# Patient Record
Sex: Female | Born: 1955 | Race: White | Hispanic: No | Marital: Single
Health system: Southern US, Academic
[De-identification: ages and names within clinical notes are randomized; demographics above are authoritative.]

## PROBLEM LIST (undated history)

## (undated) ENCOUNTER — Telehealth

## (undated) ENCOUNTER — Encounter

## (undated) ENCOUNTER — Ambulatory Visit: Payer: MEDICARE

## (undated) ENCOUNTER — Encounter: Attending: Surgical Oncology | Primary: Surgical Oncology

## (undated) ENCOUNTER — Ambulatory Visit: Attending: Surgery | Primary: Surgery

## (undated) ENCOUNTER — Ambulatory Visit: Attending: Radiation Oncology | Primary: Radiation Oncology

## (undated) ENCOUNTER — Encounter: Attending: Pharmacist | Primary: Pharmacist

## (undated) ENCOUNTER — Ambulatory Visit

## (undated) ENCOUNTER — Encounter: Attending: Obstetrics & Gynecology | Primary: Obstetrics & Gynecology

## (undated) ENCOUNTER — Encounter: Attending: Adult Health | Primary: Adult Health

## (undated) ENCOUNTER — Encounter
Attending: Student in an Organized Health Care Education/Training Program | Primary: Student in an Organized Health Care Education/Training Program

## (undated) ENCOUNTER — Telehealth: Attending: Pharmacist | Primary: Pharmacist

## (undated) ENCOUNTER — Ambulatory Visit: Payer: MEDICARE | Attending: Radiation Oncology | Primary: Radiation Oncology

## (undated) ENCOUNTER — Ambulatory Visit: Payer: MEDICARE | Attending: Surgical Oncology | Primary: Surgical Oncology

## (undated) ENCOUNTER — Encounter: Attending: Registered" | Primary: Registered"

## (undated) DIAGNOSIS — F329 Major depressive disorder, single episode, unspecified: Secondary | ICD-10-CM

## (undated) DIAGNOSIS — K219 Gastro-esophageal reflux disease without esophagitis: Secondary | ICD-10-CM

## (undated) DIAGNOSIS — C801 Malignant (primary) neoplasm, unspecified: Secondary | ICD-10-CM

## (undated) DIAGNOSIS — F32A Depression, unspecified: Secondary | ICD-10-CM

## (undated) MED ORDER — OMEPRAZOLE MAGNESIUM 20 MG TABLET,DELAYED RELEASE: Freq: Every day | ORAL | 0 days

## (undated) MED ORDER — ESTRADIOL 0.01% (0.1 MG/GRAM) VAGINAL CREAM: VAGINAL | 0.00000 days

## (undated) MED ORDER — LORATADINE-PSEUDOEPHEDRINE ER 10 MG-240 MG TABLET,EXTENDED RELEASE24HR: Freq: Every day | ORAL | 0 days

## (undated) MED ORDER — OMEPRAZOLE 10 MG CAPSULE,DELAYED RELEASE: Freq: Every morning | ORAL | 0 days

---

## 2004-07-19 ENCOUNTER — Ambulatory Visit: Payer: Self-pay | Admitting: Gastroenterology

## 2013-09-08 ENCOUNTER — Ambulatory Visit: Payer: Self-pay | Admitting: Gastroenterology

## 2013-09-12 DIAGNOSIS — D239 Other benign neoplasm of skin, unspecified: Secondary | ICD-10-CM

## 2013-09-12 HISTORY — DX: Other benign neoplasm of skin, unspecified: D23.9

## 2013-09-12 LAB — PATHOLOGY REPORT

## 2014-09-06 NOTE — Discharge Instructions (Signed)

## 2014-09-08 ENCOUNTER — Encounter
Admission: RE | Disposition: A | Discharge status: Home / Self Care | Payer: Self-pay | Source: Ambulatory Visit | Attending: Gastroenterology

## 2014-09-08 ENCOUNTER — Ambulatory Visit: Payer: BC Managed Care – PPO | Admitting: Student in an Organized Health Care Education/Training Program

## 2014-09-08 ENCOUNTER — Ambulatory Visit
Admission: RE | Admit: 2014-09-08 | Discharge: 2014-09-08 | Disposition: A | Discharge status: Home / Self Care | Payer: BC Managed Care – PPO | Source: Ambulatory Visit | Attending: Gastroenterology | Admitting: Gastroenterology

## 2014-09-08 ENCOUNTER — Encounter: Payer: Self-pay | Admitting: *Deleted

## 2014-09-08 DIAGNOSIS — K219 Gastro-esophageal reflux disease without esophagitis: Secondary | ICD-10-CM | POA: Diagnosis not present

## 2014-09-08 DIAGNOSIS — F329 Major depressive disorder, single episode, unspecified: Secondary | ICD-10-CM | POA: Diagnosis not present

## 2014-09-08 DIAGNOSIS — Z1211 Encounter for screening for malignant neoplasm of colon: Secondary | ICD-10-CM | POA: Diagnosis present

## 2014-09-08 DIAGNOSIS — Z79899 Other long term (current) drug therapy: Secondary | ICD-10-CM | POA: Insufficient documentation

## 2014-09-08 HISTORY — DX: Depression, unspecified: F32.A

## 2014-09-08 HISTORY — PX: COLONOSCOPY: SHX5424

## 2014-09-08 HISTORY — DX: Gastro-esophageal reflux disease without esophagitis: K21.9

## 2014-09-08 HISTORY — DX: Major depressive disorder, single episode, unspecified: F32.9

## 2014-09-08 SURGERY — COLONOSCOPY
Anesthesia: Monitor Anesthesia Care | Wound class: Contaminated

## 2014-09-08 MED ORDER — ACETAMINOPHEN 325 MG PO TABS: 325.0000 mg | ORAL_TABLET | ORAL | Status: DC | PRN

## 2014-09-08 MED ORDER — ONDANSETRON HCL 4 MG/2ML IJ SOLN: 4.0000 mg | Freq: Once | INTRAMUSCULAR | Status: DC | PRN

## 2014-09-08 MED ORDER — PROPOFOL 10 MG/ML IV BOLUS
INTRAVENOUS | Status: DC | PRN
Start: 2014-09-08 — End: 2014-09-08
  Administered 2014-09-08: 50 mg via INTRAVENOUS
  Administered 2014-09-08: 60 mg via INTRAVENOUS
  Administered 2014-09-08 (×2): 100 mg via INTRAVENOUS
  Administered 2014-09-08: 40 mg via INTRAVENOUS

## 2014-09-08 MED ORDER — LIDOCAINE HCL (CARDIAC) 20 MG/ML IV SOLN
INTRAVENOUS | Status: DC | PRN
  Administered 2014-09-08: 30 mg via INTRAVENOUS

## 2014-09-08 MED ORDER — STERILE WATER FOR IRRIGATION IR SOLN
Status: DC | PRN
  Administered 2014-09-08: 11:00:00

## 2014-09-08 MED ORDER — ACETAMINOPHEN 160 MG/5ML PO SOLN: 325.0000 mg | ORAL | Status: DC | PRN

## 2014-09-08 MED ORDER — LACTATED RINGERS IV SOLN
INTRAVENOUS | Status: DC
  Administered 2014-09-08: 11:00:00 via INTRAVENOUS

## 2014-09-08 SURGICAL SUPPLY — 27 items
CANISTER SUCT 1200ML W/VALVE (MISCELLANEOUS) ×3 IMPLANT
FCP ESCP3.2XJMB 240X2.8X (MISCELLANEOUS)
FORCEPS BIOP RAD 4 LRG CAP 4 (CUTTING FORCEPS) IMPLANT
FORCEPS BIOP RJ4 240 W/NDL (MISCELLANEOUS)
FORCEPS ESCP3.2XJMB 240X2.8X (MISCELLANEOUS) IMPLANT
GOWN CVR UNV OPN BCK APRN NK (MISCELLANEOUS) ×2 IMPLANT
GOWN ISOL THUMB LOOP REG UNIV (MISCELLANEOUS) ×4
HEMOCLIP INSTINCT (CLIP) IMPLANT
INJECTOR VARIJECT VIN23 (MISCELLANEOUS) IMPLANT
KIT CO2 TUBING (TUBING) ×3 IMPLANT
KIT DEFENDO VALVE AND CONN (KITS) IMPLANT
KIT ENDO PROCEDURE OLY (KITS) ×3 IMPLANT
LIGATOR MULTIBAND 6SHOOTER MBL (MISCELLANEOUS) IMPLANT
MARKER SPOT ENDO TATTOO 5ML (MISCELLANEOUS) IMPLANT
PAD GROUND ADULT SPLIT (MISCELLANEOUS) IMPLANT
SNARE SHORT THROW 13M SML OVAL (MISCELLANEOUS) IMPLANT
SNARE SHORT THROW 30M LRG OVAL (MISCELLANEOUS) IMPLANT
SPOT EX ENDOSCOPIC TATTOO (MISCELLANEOUS)
SUCTION POLY TRAP 4CHAMBER (MISCELLANEOUS) IMPLANT
TRAP SUCTION POLY (MISCELLANEOUS) IMPLANT
TUBING CONN 6MMX3.1M (TUBING)
TUBING SUCTION CONN 0.25 STRL (TUBING) IMPLANT
UNDERPAD 30X60 958B10 (PK) (MISCELLANEOUS) IMPLANT
VALVE BIOPSY ENDO (VALVE) IMPLANT
VARIJECT INJECTOR VIN23 (MISCELLANEOUS)
WATER AUXILLARY (MISCELLANEOUS) IMPLANT
WATER STERILE IRR 500ML POUR (IV SOLUTION) ×3 IMPLANT

## 2014-09-08 NOTE — Anesthesia Postprocedure Evaluation (Signed)
  Anesthesia Post-op Note  Patient: Teresa Ponce  Procedure(s) Performed: Procedure(s): COLONOSCOPY (N/A)  Anesthesia type:MAC  Patient location: PACU  Post pain: Pain level controlled  Post assessment: Post-op Vital signs reviewed, Patient's Cardiovascular Status Stable, Respiratory Function Stable, Patent Airway and No signs of Nausea or vomiting  Post vital signs: Reviewed and stable  Last Vitals:  Filed Vitals:   09/08/14 1115  BP: 77/44  Pulse: 58  Temp:   Resp: 13    Level of consciousness: awake, alert  and patient cooperative  Complications: No apparent anesthesia complications

## 2014-09-08 NOTE — Transfer of Care (Signed)
Immediate Anesthesia Transfer of Care Note  Patient: Teresa Ponce  Procedure(s) Performed: Procedure(s): COLONOSCOPY (N/A)  Patient Location: PACU  Anesthesia Type: MAC  Level of Consciousness: awake, alert  and patient cooperative  Airway and Oxygen Therapy: Patient Spontanous Breathing and Patient connected to supplemental oxygen  Post-op Assessment: Post-op Vital signs reviewed, Patient's Cardiovascular Status Stable, Respiratory Function Stable, Patent Airway and No signs of Nausea or vomiting  Post-op Vital Signs: Reviewed and stable  Complications: No apparent anesthesia complications

## 2014-09-08 NOTE — Anesthesia Procedure Notes (Signed)
Procedure Name: MAC Performed by: Celise Bazar Pre-anesthesia Checklist: Patient identified, Emergency Drugs available, Suction available, Patient being monitored and Timeout performed Patient Re-evaluated:Patient Re-evaluated prior to inductionOxygen Delivery Method: Nasal cannula       

## 2014-09-08 NOTE — Anesthesia Preprocedure Evaluation (Signed)
Anesthesia Evaluation  Patient identified by MRN, date of birth, ID band Patient awake    Reviewed: Allergy & Precautions, NPO status , Patient's Chart, lab work & pertinent test results  Airway Mallampati: II  TM Distance: >3 FB Neck ROM: Full    Dental   Pulmonary    Pulmonary exam normal       Cardiovascular Normal cardiovascular exam    Neuro/Psych PSYCHIATRIC DISORDERS Depression    GI/Hepatic GERD-  ,  Endo/Other    Renal/GU      Musculoskeletal   Abdominal   Peds  Hematology   Anesthesia Other Findings   Reproductive/Obstetrics                             Anesthesia Physical Anesthesia Plan  ASA: II  Anesthesia Plan: MAC   Post-op Pain Management:    Induction: Intravenous  Airway Management Planned: Nasal Cannula  Additional Equipment:   Intra-op Plan:   Post-operative Plan:   Informed Consent: I have reviewed the patients History and Physical, chart, labs and discussed the procedure including the risks, benefits and alternatives for the proposed anesthesia with the patient or authorized representative who has indicated his/her understanding and acceptance.     Plan Discussed with: CRNA  Anesthesia Plan Comments:         Anesthesia Quick Evaluation

## 2014-09-08 NOTE — H&P (Signed)
  [facility]  [address], [place], [place] [address] Phone: [phone] Fax : [phone]  Primary Care Physician:  Laverta Baltimore, MD Primary Gastroenterologist:  Dr. Allen Norris  Pre-Procedure History & Physical: HPI:  Teresa Ponce is a 59 y.o. female is here for a screening colonoscopy.   Past Medical History  Diagnosis Date  . Depression   . GERD (gastroesophageal reflux disease)     History reviewed. No pertinent past surgical history.  Prior to Admission medications   Medication Sig Start Date End Date Taking? Authorizing Provider  Calcium-Vitamin D (CALTRATE 600 PLUS-VIT D PO) Take by mouth 2 (two) times daily.   Yes Historical Provider, MD  citalopram (CELEXA) 20 MG tablet Take 20 mg by mouth every morning.   Yes Historical Provider, MD  Melatonin 1 MG TABS Take by mouth at bedtime.   Yes Historical Provider, MD  omeprazole (PRILOSEC) 10 MG capsule Take 10 mg by mouth every morning.   Yes Historical Provider, MD    Allergies as of 08/28/2014  . (Not on File)    History reviewed. No pertinent family history.  History   Social History  . Marital Status: Married    Spouse Name: N/A  . Number of Children: N/A  . Years of Education: N/A   Occupational History  . Not on file.   Social History Main Topics  . Smoking status: Never Smoker   . Smokeless tobacco: Not on file  . Alcohol Use: Yes     Comment: socially  . Drug Use: Not on file  . Sexual Activity: Not on file   Other Topics Concern  . Not on file   Social History Narrative    Review of Systems: See HPI, otherwise negative ROS  Physical Exam: BP 142/65 mmHg  Pulse 65  Temp(Src) 97.9 F (36.6 C)  Resp 17  Ht 5\' 6"  (1.676 m)  Wt 137 lb (62.143 kg)  BMI 22.12 kg/m2  SpO2 99% General:   Alert,  pleasant and cooperative in NAD Head:  Normocephalic and atraumatic. Neck:  Supple; no masses or thyromegaly. Lungs:  Clear throughout to auscultation.    Heart:   Regular rate and rhythm. Abdomen:  Soft, nontender and nondistended. Normal bowel sounds, without guarding, and without rebound.   Neurologic:  Alert and  oriented x4;  grossly normal neurologically.  Impression/Plan: Teresa Ponce is now here to undergo a screening colonoscopy.  Risks, benefits, and alternatives regarding colonoscopy have been reviewed with the patient.  Questions have been answered.  All parties agreeable.

## 2014-09-08 NOTE — Op Note (Signed)
[facility] Patient Name: Teresa Ponce Procedure Date: 09/08/2014 10:34 AM MRN: [id number] Account #: [id number] Date of Birth: [birth year] Admit Type: Outpatient Age: 59 Room: [facility] OR ROOM 01 Gender: Female Note Status: Finalized Procedure:         Colonoscopy Indications:       Screening for colorectal malignant neoplasm Providers:         Lucilla Lame, MD Referring MD:      Boykin Nearing, MD (Referring MD) Medicines:         Propofol per Anesthesia Complications:     No immediate complications. Procedure:         Pre-Anesthesia Assessment:                    - Prior to the procedure, a History and Physical was                     performed, and patient medications and allergies were                     reviewed. The patient's tolerance of previous anesthesia                     was also reviewed. The risks and benefits of the procedure                     and the sedation options and risks were discussed with the                     patient. All questions were answered, and informed consent                     was obtained. Prior Anticoagulants: The patient has taken                     no previous anticoagulant or antiplatelet agents. ASA                     Grade Assessment: II - A patient with mild systemic                     disease. After reviewing the risks and benefits, the                     patient was deemed in satisfactory condition to undergo                     the procedure.                    After obtaining informed consent, the colonoscope was                     passed under direct vision. Throughout the procedure, the                     patient's blood pressure, pulse, and oxygen saturations                     were monitored continuously. The was introduced through                     the anus and advanced to the the cecum, identified by  appendiceal orifice and ileocecal valve. The colonoscopy                      was performed without difficulty. The patient tolerated                     the procedure well. The quality of the bowel preparation                     was excellent. Findings:      The perianal and digital rectal examinations were normal.      The colon (entire examined portion) appeared normal. Impression:        - The entire examined colon is normal.                    - No specimens collected. Recommendation:    - Repeat colonoscopy in 10 years for screening unless any                     change in family history or lower GI problems. Procedure Code(s): --- Professional ---                    [id number], Colonoscopy, flexible; diagnostic, including                     collection of specimen(s) by brushing or washing, when                     performed (separate procedure) Diagnosis Code(s): --- Professional ---                    Z12.11, Encounter for screening for malignant neoplasm of                     colon CPT copyright 2014 American Medical Association. All rights reserved. The codes documented in this report are preliminary and upon coder review may  be revised to meet current compliance requirements. Lucilla Lame, MD 09/08/2014 11:04:30 AM This report has been signed electronically. Number of Addenda: 0 Note Initiated On: 09/08/2014 10:34 AM Scope Withdrawal Time: 0 hours 6 minutes 27 seconds  Total Procedure Duration: 0 hours 12 minutes 10 seconds       [facility]

## 2014-09-12 ENCOUNTER — Encounter: Payer: Self-pay | Admitting: Gastroenterology

## 2017-08-12 ENCOUNTER — Encounter: Admit: 2017-08-12 | Discharge: 2017-08-12 | Payer: MEDICARE

## 2017-08-12 DIAGNOSIS — Z9289 Personal history of other medical treatment: Principal | ICD-10-CM

## 2018-09-15 ENCOUNTER — Ambulatory Visit: Admit: 2018-09-15 | Discharge: 2018-09-16 | Payer: MEDICARE

## 2018-09-15 DIAGNOSIS — Z1231 Encounter for screening mammogram for malignant neoplasm of breast: Principal | ICD-10-CM

## 2019-06-16 ENCOUNTER — Ambulatory Visit: Payer: Medicare PPO

## 2019-09-15 DIAGNOSIS — Z1231 Encounter for screening mammogram for malignant neoplasm of breast: Principal | ICD-10-CM

## 2019-09-19 ENCOUNTER — Ambulatory Visit: Admit: 2019-09-19 | Discharge: 2019-09-20 | Payer: MEDICARE

## 2019-09-23 DIAGNOSIS — R928 Other abnormal and inconclusive findings on diagnostic imaging of breast: Principal | ICD-10-CM

## 2019-10-03 ENCOUNTER — Other Ambulatory Visit: Payer: Self-pay

## 2019-10-03 ENCOUNTER — Ambulatory Visit: Payer: Medicare PPO | Admitting: Dermatology

## 2019-10-03 ENCOUNTER — Encounter: Payer: Self-pay | Admitting: Dermatology

## 2019-10-03 DIAGNOSIS — Z1283 Encounter for screening for malignant neoplasm of skin: Secondary | ICD-10-CM | POA: Diagnosis not present

## 2019-10-03 DIAGNOSIS — D229 Melanocytic nevi, unspecified: Secondary | ICD-10-CM

## 2019-10-03 DIAGNOSIS — L720 Epidermal cyst: Secondary | ICD-10-CM | POA: Diagnosis not present

## 2019-10-03 DIAGNOSIS — L821 Other seborrheic keratosis: Secondary | ICD-10-CM

## 2019-10-03 DIAGNOSIS — L918 Other hypertrophic disorders of the skin: Secondary | ICD-10-CM

## 2019-10-03 DIAGNOSIS — L719 Rosacea, unspecified: Secondary | ICD-10-CM

## 2019-10-03 DIAGNOSIS — L814 Other melanin hyperpigmentation: Secondary | ICD-10-CM

## 2019-10-03 DIAGNOSIS — Z86018 Personal history of other benign neoplasm: Secondary | ICD-10-CM

## 2019-10-03 DIAGNOSIS — D1801 Hemangioma of skin and subcutaneous tissue: Secondary | ICD-10-CM

## 2019-10-03 DIAGNOSIS — L578 Other skin changes due to chronic exposure to nonionizing radiation: Secondary | ICD-10-CM

## 2019-10-03 MED ORDER — METRONIDAZOLE 0.75 % EX GEL: Freq: Every day | CUTANEOUS | 12 refills | Status: DC

## 2019-10-03 MED ORDER — TRETINOIN 0.025 % EX GEL: Freq: Every evening | CUTANEOUS | 12 refills | Status: AC

## 2019-10-03 NOTE — Progress Notes (Signed)
   Follow-Up Visit   Subjective  Teresa Ponce is a 64 y.o. female who presents for the following: Annual Exam (History of dysplastic nevus of left axilla. TBSE today.). The patient presents for Total-Body Skin Exam (TBSE) for skin cancer screening and mole check.  The following portions of the chart were reviewed this encounter and updated as appropriate:  Allergies  Meds  Problems  Med Hx  Surg Hx  Fam Hx      Review of Systems:  No other skin or systemic complaints except as noted in HPI or Assessment and Plan.  Objective  Well appearing patient in no apparent distress; mood and affect are within normal limits.  A full examination was performed including scalp, head, eyes, ears, nose, lips, neck, chest, axillae, abdomen, back, buttocks, bilateral upper extremities, bilateral lower extremities, hands, feet, fingers, toes, fingernails, and toenails. All findings within normal limits unless otherwise noted below.  Objective  Left Axilla: Scar with no evidence of recurrence.   Objective  face: Dilated blood vessels, otherwise clear.  Objective  face: Smooth white papule(s).    Assessment & Plan    Acrochordons (Skin Tags) - Fleshy, skin-colored pedunculated papules - Benign appearing.  - Observe. - If desired, they can be removed with an in office procedure that is not covered by insurance. - Please call the clinic if you notice any new or changing lesions.  Lentigines - Scattered tan macules - Discussed due to sun exposure - Benign, observe - Call for any changes  Seborrheic Keratoses - Stuck-on, waxy, tan-brown papules and plaques  - Discussed benign etiology and prognosis. - Observe - Call for any changes  Melanocytic Nevi - Tan-brown and/or pink-flesh-colored symmetric macules and papules - Benign appearing on exam today - Observation - Call clinic for new or changing moles - Recommend daily use of broad spectrum spf 30+ sunscreen to sun-exposed  areas.   Hemangiomas - Red papules - Discussed benign nature - Observe - Call for any changes  Actinic Damage - diffuse scaly erythematous macules with underlying dyspigmentation - Recommend daily broad spectrum sunscreen SPF 30+ to sun-exposed areas, reapply every 2 hours as needed.  - Call for new or changing lesions.  Skin cancer screening performed today.   History of dysplastic nevus Left Axilla Clear today. Observe  Rosacea Face Continue Metrogel qd - bid  Milia/ Acne milia of face face Altreno sample given or she may buy The Perfect A here.  tretinoin (RETIN-A) 0.025 % gel - face  Return in about 1 year (around 10/02/2020).  I, Ashok Cordia, CMA, am acting as scribe for Sarina Ser, MD .  Documentation: I have reviewed the above documentation for accuracy and completeness, and I agree with the above.  Sarina Ser, MD

## 2019-10-04 ENCOUNTER — Encounter: Payer: Self-pay | Admitting: Dermatology

## 2019-10-10 ENCOUNTER — Ambulatory Visit: Admit: 2019-10-10 | Discharge: 2019-10-11 | Payer: MEDICARE

## 2019-10-11 ENCOUNTER — Ambulatory Visit: Admit: 2019-10-11 | Discharge: 2019-10-12 | Payer: MEDICARE

## 2019-10-11 DIAGNOSIS — R928 Other abnormal and inconclusive findings on diagnostic imaging of breast: Principal | ICD-10-CM

## 2019-10-12 ENCOUNTER — Ambulatory Visit: Admit: 2019-10-12 | Discharge: 2019-10-13 | Payer: MEDICARE

## 2019-10-19 ENCOUNTER — Ambulatory Visit
Admit: 2019-10-19 | Discharge: 2019-10-20 | Payer: MEDICARE | Attending: Radiation Oncology | Primary: Radiation Oncology

## 2019-10-19 ENCOUNTER — Ambulatory Visit: Admit: 2019-10-19 | Discharge: 2019-10-20 | Payer: MEDICARE

## 2019-10-19 DIAGNOSIS — Z170 Estrogen receptor positive status [ER+]: Principal | ICD-10-CM

## 2019-10-19 DIAGNOSIS — C50911 Malignant neoplasm of unspecified site of right female breast: Principal | ICD-10-CM

## 2019-10-19 DIAGNOSIS — C50411 Malignant neoplasm of upper-outer quadrant of right female breast: Principal | ICD-10-CM

## 2019-10-21 DIAGNOSIS — C50911 Malignant neoplasm of unspecified site of right female breast: Principal | ICD-10-CM

## 2019-10-21 DIAGNOSIS — Z170 Estrogen receptor positive status [ER+]: Principal | ICD-10-CM

## 2019-10-24 ENCOUNTER — Ambulatory Visit: Admit: 2019-10-24 | Discharge: 2019-10-25 | Payer: MEDICARE

## 2019-10-31 DIAGNOSIS — C50911 Malignant neoplasm of unspecified site of right female breast: Principal | ICD-10-CM

## 2019-10-31 DIAGNOSIS — Z170 Estrogen receptor positive status [ER+]: Principal | ICD-10-CM

## 2019-10-31 MED ORDER — PEGFILGRASTIM-BMEZ 6 MG/0.6 ML SUBCUTANEOUS SYRINGE
Freq: Once | SUBCUTANEOUS | 5 refills | 1.00000 days | Status: CP
Start: 2019-10-31 — End: 2019-10-31
  Filled 2019-11-04: qty 0.6, 21d supply, fill #0

## 2019-11-01 ENCOUNTER — Ambulatory Visit: Admit: 2019-11-01 | Discharge: 2019-11-01 | Payer: MEDICARE

## 2019-11-01 DIAGNOSIS — Z170 Estrogen receptor positive status [ER+]: Principal | ICD-10-CM

## 2019-11-01 DIAGNOSIS — C50911 Malignant neoplasm of unspecified site of right female breast: Principal | ICD-10-CM

## 2019-11-02 DIAGNOSIS — Z170 Estrogen receptor positive status [ER+]: Principal | ICD-10-CM

## 2019-11-02 DIAGNOSIS — C50911 Malignant neoplasm of unspecified site of right female breast: Principal | ICD-10-CM

## 2019-11-04 MED FILL — ZIEXTENZO 6 MG/0.6 ML SUBCUTANEOUS SYRINGE: 21 days supply | Qty: 1 | Fill #0 | Status: AC

## 2019-11-04 NOTE — Unmapped (Signed)
[facility]   Patient Onboarding/Medication Counseling    Robin Spence is a 64 y.o. female with breast cancer (neutropenia) who I am counseling today on initiation of therapy.  I am speaking to the patient.    Was a Nurse, learning disability used for this call? No    Verified patient's date of birth / HIPAA.    Specialty medication(s) to be sent: Hematology/Oncology: Ziextenzo      Non-specialty medications/supplies to be sent: n/a      Medications not needed at this time: n/a         Ziextenzo (pegfilgrastim)    Medication & Administration     Dosage: inject the contents of 1 syringe under the skin once 24 hours after chemotherapy.  A friend (nurse or EMT) will be giving her the injection    Administration: Inject under the skin of the thigh, abdomen, buttocks or upper arm. Rotate sites with each injection.  ??? Injection instructions   o Take 1 syringe out of the refrigerator and allow to stand at room temperature for at least 15-30 minutes  o Wash hands and remove syringe from the tray  o Check the syringe for the following   - Expiration date  - Medication is clear and colorless to slightly yellow and free from particles   - It appears unused or damaged and the gray needle cap is securely attached and the clear needle guard has not been activated (if the needle guard is covering the needle that means it has been activated)  o Choose your injection site (abdomen but not within 2 inches of the navel, thigh, or if someone else is injecting you may also use upper arms or upper outer area of buttock).  Choose a different site each time you give yourself an injection and do not inject into areas where the skin is tender, bruised, red, scaly or hard.  Avoid areas with scars or stretch marks  o Clean the injection site with an alcohol wipe using a circular motion and allow it to air dry completely (5-10 seconds)  o Hold the prefilled syringe by the syringe barrel.  Carefully pull the needle cap straight off and discard  o With your other hand, gently pinch the skin at the injection site to create a firm surface.  Insert the needle in to skin at a 45-90 degree angle. Push the needle all the way in to ensure that the medicine can be fully injected.  o Slowly press down the plunger head as far as it will go until the plunger head is completely between the needle guard wings (keep skin pinched during this)  o Keep the plunger fully pressed down while you carefully pull the needle straight out from the injection site and off your skin  o Slowly release the plunger and allow the syringe needle guard to automatically cover the exposed needle  o Dispose of the used prefilled syringe into a sharps container or hard plastic bottle.  o If there is blood at the injection site gently press a cotton ball or gauze to the site. Do not rub the injection site. Apply an adhesive bandage if needed    Adherence/Missed dose instruction: If a dose is missed, call your doctor.    Goals of Therapy     Stimulate the growth of neutrophils (a type of white blood important to fight against infection) used after chemotherapy.    Side Effects & Monitoring Parameters   ??? Injection site irritation  ???  Pain/aching in the bones, arms and legs    The following side effects should be reported to the provider:  ??? Signs of an allergic reaction (rash, hives, shortness of breath, tongue or throat swelling)  ??? Kidney problems (unable to pass urine, blood in urine, change in amount of urine passed, change in urine color, or weight gain)  ??? Lung problems (trouble breathing, new or worsening of cough)  ??? Capillary leak syndrome (abnormal heartbeat, chest pain, shortness of breath, weight gain, vomiting blood or vomit that looks like coffee grounds, black or tarry stools)  ??? Spleen rupture (pain in left upper stomach area or left shoulder)  ??? Inflammation of the aorta (fever, abdominal pain, fatigue, back pain)                Contraindications, Warnings, & Precautions ??? Hypersensitivity to pegfilgrastim, filgrastim, or any components of the formulation  ??? Allergy to latex  ??? Pegfilgrastim does cross the placenta therefore you should not become pregnant during treatment  ??? It is not known if pegfilgrastim passes into breastmilk therefore not recommended      Drug/Food Interactions     ??? Medication list reviewed in Epic. The patient was instructed to inform the care team before taking any new medications or supplements. No drug interactions identified.     Storage, Handling Precautions, & Disposal     ??? Ziextenzo should be stored in the refrigerator.   ??? Avoid freezing syringe but if frozen may be thawed one time. If frozen more than 1 time use a new syringe  ??? Discard if kept at room temperature for >120 hours  ??? Do not open the outer carton until you are ready to use the syringe in order to protect it from light  ??? Do not use if the syringe has been dropped on a hard surface.  It may be broken even if you cannot see the break  ??? Do not shake the prefilled syringe  ??? Keep out of the reach of children  ??? Place used devices into a sharps container for disposal (which we can supply along with band-aids and alcohol pads) or hard plastic container       Current Medications (including OTC/herbals), Comorbidities and Allergies     Current Outpatient Medications   Medication Sig Dispense Refill   ??? citalopram (CELEXA) 20 MG tablet Take 20 mg by mouth daily.     ??? cyanocobalamin, vitamin B-12, 1000 MCG tablet Take 1 tablet by mouth Take as directed.     ??? famotidine (PEPCID) 40 MG tablet Take 40 mg by mouth Take as directed.     ??? fish oil-omega-3 fatty acids 300-1,000 mg capsule Take 1 capsule by mouth Take as directed.     ??? melatonin 1 mg Tab tablet Take 1 tablet by mouth at bedtime as needed.     ??? metroNIDAZOLE (METROGEL) 0.75 % gel Apply 1 application topically Take as directed.     ??? needles, pharmacy compound 20 x 1  Ndle Insert 1 application into the vagina Take as directed. ??? [START ON 11/06/2019] ondansetron (ZOFRAN) 8 MG tablet Take 1 tablet (8 mg total) by mouth every eight (8) hours as needed for nausea (or vomiting). 30 tablet 2   ??? [START ON 11/06/2019] prochlorperazine (COMPAZINE) 10 MG tablet Take 1 tablet (10 mg total) by mouth every six (6) hours as needed (nausea or vomitting). 30 tablet 2   ??? tretinoin (RETIN-A) 0.025 % gel Apply 1 application topically  Take as directed.       Current Facility-Administered Medications   Medication Dose Route Frequency Provider Last Rate Last Admin   ??? lidocaine-EPINEPHrine (XYLOCAINE W/EPI) 1 %-1:100,000 injection 4 mL  4 mL Intradermal Once Genia Del, ANP           Allergies   Allergen Reactions   ??? Sulfa (Sulfonamide Antibiotics) Hives   ??? Tetanus Vaccines And Toxoid Hives     As a child       Patient Active Problem List   Diagnosis   ??? Malignant neoplasm of right breast in female, estrogen receptor positive (CMS-HCC)       Reviewed and up to date in Epic.    Appropriateness of Therapy     Is medication and dose appropriate based on diagnosis? Yes    Prescription has been clinically reviewed: Yes    Baseline Quality of Life Assessment      How many days over the past month did your condition  keep you from your normal activities? For example, brushing your teeth or getting up in the morning. 0    Financial Information     Medication Assistance provided: Prior Authorization    Anticipated copay of $100 reviewed with patient. Verified delivery address.    Delivery Information     Scheduled delivery date: 11/04/19    Expected start date: 11/08/19    Medication will be delivered via Same Day Courier to the prescription address in [facility].  This shipment will not require a signature.      Explained the services we provide at [facility] and that each month we would call to set up refills.  Stressed importance of returning phone calls so that we could ensure they receive their medications in time each month. Informed patient that we should be setting up refills 7-10 days prior to when they will run out of medication.  A pharmacist will reach out to perform a clinical assessment periodically.  Informed patient that a welcome packet and a drug information handout will be sent.      Patient verbalized understanding of the above information as well as how to contact the pharmacy at [phone] option 4 with any questions/concerns.  The pharmacy is open Monday through Friday 8:30am-4:30pm.  A pharmacist is available 24/7 via pager to answer any clinical questions they may have.    Patient Specific Needs     - Does the patient have any physical, cognitive, or cultural barriers? No    - Patient prefers to have medications discussed with  Patient     - Is the patient or caregiver able to read and understand education materials at a high school level or above? Yes    - Patient's primary language is  English     - Is the patient high risk? No     - Does the patient require a Care Management Plan? No     - Does the patient require physician intervention or other additional services (i.e. nutrition, smoking cessation, social work)? No      Florene Route [facility] Specialty Pharmacist

## 2019-11-04 NOTE — Unmapped (Signed)
Pharmacy medication counseling    Patient Information: Robin Spence is a 64 y.o. female with breast cancer who I have counseled prior to the initiation of pegfilgrastim by self-administration.     I called Robin Spence and explained that her insurance has denied Neulasta Onpro.  Her insurance has approved Ziextenzo to be supplied by the [employer].  She has consented to pay the copay for the first syringe but would like to seek manufacturer assistance for refills.  If she is not able to get manufacturer assistance for self-administration, we will determine if she can return to [facility] for nurse administration of Ziextenzo, as a back-up for subsequent cycles.    I reviewed that pegfilgrastim is a subcutaneous injection administered 24 hours post-chemotherapy for prevention of chemotherapy-induced neutropenia   The most common adverse effects are injection site pain and bone pain..  This medication should be kept refrigerated but can be removed from the fridge 30 minutes prior to administration to decrease injection site pain.  I discussed proper administration of a SQ injection as well as syringe storage and disposal.    Handout provided:  Patient education handout for pegfilgrastim    Patient verbalized understanding of the above information.  Approximate time spent with patient: 15 minutes

## 2019-11-06 MED ORDER — PROCHLORPERAZINE MALEATE 10 MG TABLET
ORAL_TABLET | Freq: Four times a day (QID) | ORAL | 2 refills | 8 days | Status: CP | PRN
Start: 2019-11-06 — End: ?

## 2019-11-06 MED ORDER — ONDANSETRON HCL 8 MG TABLET
ORAL_TABLET | Freq: Three times a day (TID) | ORAL | 2 refills | 10 days | Status: CP | PRN
Start: 2019-11-06 — End: ?

## 2019-11-07 ENCOUNTER — Other Ambulatory Visit: Admit: 2019-11-07 | Discharge: 2019-11-08 | Payer: MEDICARE

## 2019-11-07 ENCOUNTER — Ambulatory Visit: Admit: 2019-11-07 | Discharge: 2019-11-08 | Payer: MEDICARE | Attending: Adult Health | Primary: Adult Health

## 2019-11-07 ENCOUNTER — Ambulatory Visit: Admit: 2019-11-07 | Discharge: 2019-11-08 | Payer: MEDICARE

## 2019-11-07 DIAGNOSIS — C50911 Malignant neoplasm of unspecified site of right female breast: Principal | ICD-10-CM

## 2019-11-07 DIAGNOSIS — Z170 Estrogen receptor positive status [ER+]: Principal | ICD-10-CM

## 2019-11-07 LAB — EGFR CKD-EPI AA FEMALE: Glomerular filtration rate/1.73 sq M.predicted.black:ArVRat:Pt:Ser/Plas/Bld:Qn:Creatinine-based formula (CKD-EPI): 89

## 2019-11-07 LAB — CBC W/ AUTO DIFF
BASOPHILS ABSOLUTE COUNT: 0 10*9/L (ref 0.0–0.1)
BASOPHILS RELATIVE PERCENT: 0.5 %
EOSINOPHILS ABSOLUTE COUNT: 0.1 10*9/L (ref 0.0–0.4)
EOSINOPHILS RELATIVE PERCENT: 1.3 %
HEMOGLOBIN: 13.4 g/dL (ref 12.0–16.0)
LARGE UNSTAINED CELLS: 2 % (ref 0–4)
LYMPHOCYTES ABSOLUTE COUNT: 1.3 10*9/L — ABNORMAL LOW (ref 1.5–5.0)
LYMPHOCYTES RELATIVE PERCENT: 21.7 %
MEAN CORPUSCULAR HEMOGLOBIN CONC: 32.5 g/dL (ref 31.0–37.0)
MEAN CORPUSCULAR HEMOGLOBIN: 30.7 pg (ref 26.0–34.0)
MEAN CORPUSCULAR VOLUME: 94.7 fL (ref 80.0–100.0)
MEAN PLATELET VOLUME: 8.1 fL (ref 7.0–10.0)
MONOCYTES ABSOLUTE COUNT: 0.4 10*9/L (ref 0.2–0.8)
NEUTROPHILS ABSOLUTE COUNT: 3.9 10*9/L (ref 2.0–7.5)
NEUTROPHILS RELATIVE PERCENT: 67.8 %
PLATELET COUNT: 219 10*9/L (ref 150–440)
RED BLOOD CELL COUNT: 4.36 10*12/L (ref 4.00–5.20)
RED CELL DISTRIBUTION WIDTH: 13.3 % (ref 12.0–15.0)
WBC ADJUSTED: 5.8 10*9/L (ref 4.5–11.0)

## 2019-11-07 LAB — HEPATIC FUNCTION PANEL
ALBUMIN: 4.1 g/dL (ref 3.4–5.0)
ALKALINE PHOSPHATASE: 62 U/L (ref 46–116)
ALT (SGPT): 16 U/L (ref 10–49)
AST (SGOT): 23 U/L (ref <=34)
BILIRUBIN TOTAL: 0.5 mg/dL (ref 0.3–1.2)

## 2019-11-07 LAB — CREATININE: EGFR CKD-EPI NON-AA FEMALE: 77 mL/min/{1.73_m2} (ref >=60)

## 2019-11-07 LAB — ALT (SGPT): Alanine aminotransferase:CCnc:Pt:Ser/Plas:Qn:: 16

## 2019-11-07 LAB — MEAN CORPUSCULAR HEMOGLOBIN CONC: Erythrocyte mean corpuscular hemoglobin concentration:MCnc:Pt:RBC:Qn:Automated count: 32.5

## 2019-11-07 MED ORDER — LIDOCAINE-PRILOCAINE 2.5 %-2.5 % TOPICAL CREAM
1 refills | 0 days | Status: CP
Start: 2019-11-07 — End: ?

## 2019-11-07 MED ADMIN — ondansetron (ZOFRAN) tablet 24 mg: 24 mg | ORAL | @ 17:00:00 | Stop: 2019-11-07

## 2019-11-07 MED ADMIN — trastuzumab-anns (KANJINTI) 504 mg in sodium chloride (NS) 0.9 % 250 mL IVPB: 8 mg/kg | INTRAVENOUS | @ 19:00:00 | Stop: 2019-11-07

## 2019-11-07 MED ADMIN — CARBOplatin (PARAPLATIN) 562.8 mg in sodium chloride (NS) 0.9 % 100 mL IVPB: 562.8 mg | INTRAVENOUS | @ 22:00:00 | Stop: 2019-11-07

## 2019-11-07 MED ADMIN — fosaprepitant (EMEND) 150 mg in sodium chloride (NS) 0.9 % 100 mL IVPB: 150 mg | INTRAVENOUS | @ 17:00:00 | Stop: 2019-11-07

## 2019-11-07 MED ADMIN — DOCEtaxel (TAXOTERE) 128.3 mg in sodium chloride 0.9% NON-PVC (NS) 0.9 % 250 mL IVPB: 75 mg/m2 | INTRAVENOUS | @ 21:00:00 | Stop: 2019-11-07

## 2019-11-07 MED ADMIN — heparin, porcine (PF) 100 unit/mL injection 500 Units: 500 [IU] | INTRAVENOUS | @ 23:00:00 | Stop: 2019-11-08

## 2019-11-07 MED ADMIN — dexAMETHasone (DECADRON) tablet 8 mg: 8 mg | ORAL | @ 17:00:00 | Stop: 2019-11-07

## 2019-11-07 MED ADMIN — pertuzumab (PERJETA) 840 mg in sodium chloride (NS) 0.9 % 250 mL IVPB: 840 mg | INTRAVENOUS | @ 18:00:00 | Stop: 2019-11-07

## 2019-11-07 MED ADMIN — sodium chloride (NS) 0.9 % infusion: 100 mL/h | INTRAVENOUS | @ 17:00:00

## 2019-11-07 NOTE — Unmapped (Signed)
Pharmacy: First Cycle Chemotherapy Patient Education    Chemotherapy regimen/agents: TCHP  Venous Access: Port  Caregivers present for education: Sister    Ms. Robin Spence is a 64 year old with breast cancer. Chemotherapy education was provided to the patient by an oncology pharmacy resident.      Side effects discussed included but were not limited to:   infusion-related reactions, complications associated with myelosuppresion (such as infection/fever, fatigue, and bleeding), nausea/vomiting, diarrhea, constipation, flu-like symptoms, hair loss, peripheral neuropathy or fatigue.    Confirm patient has Ziextenzo at home, and she has a plan for administration on day 3. Reviewed prevention of bone pain     [facility] patient handout, antiemetic handout or the Hematology/Oncology Fellow on-call phone number for concerns after hours were given.The patient verbalized understanding of this information.  Medication reconciliation was completed and the medication list was updated in EPIC.      Approximate time spent with patient: 15  Minutes.    Tanica Gaige Laveda Norman, PharmD  Kennon Holter, PharmD, CPP

## 2019-11-07 NOTE — Unmapped (Signed)
Lab on 11/07/2019   Component Date Value Ref Range Status   ??? Creatinine 11/07/2019 0.80  0.60 - 0.80 mg/dL Final   ??? EGFR CKD-EPI Non-African American,* 11/07/2019 77  >=60 mL/min/1.10m2 Final   ??? EGFR CKD-EPI African American, Fem* 11/07/2019 89  >=60 mL/min/1.40m2 Final   ??? Albumin 11/07/2019 4.1  3.4 - 5.0 g/dL Final   ??? Total Protein 11/07/2019 6.8  5.7 - 8.2 g/dL Final   ??? Total Bilirubin 11/07/2019 0.5  0.3 - 1.2 mg/dL Final   ??? Bilirubin, Direct 11/07/2019 0.10  0.00 - 0.30 mg/dL Final   ??? AST 08/65/7846 23  <=34 U/L Final   ??? ALT 11/07/2019 16  10 - 49 U/L Final   ??? Alkaline Phosphatase 11/07/2019 62  46 - 116 U/L Final   ??? WBC 11/07/2019 5.8  4.5 - 11.0 10*9/L Final   ??? RBC 11/07/2019 4.36  4.00 - 5.20 10*12/L Final   ??? HGB 11/07/2019 13.4  12.0 - 16.0 g/dL Final   ??? HCT 96/29/5284 41.3  36.0 - 46.0 % Final   ??? MCV 11/07/2019 94.7  80.0 - 100.0 fL Final   ??? MCH 11/07/2019 30.7  26.0 - 34.0 pg Final   ??? MCHC 11/07/2019 32.5  31.0 - 37.0 g/dL Final   ??? RDW 13/24/4010 13.3  12.0 - 15.0 % Final   ??? MPV 11/07/2019 8.1  7.0 - 10.0 fL Final   ??? Platelet 11/07/2019 219  150 - 440 10*9/L Final   ??? Neutrophils % 11/07/2019 67.8  % Final   ??? Lymphocytes % 11/07/2019 21.7  % Final   ??? Monocytes % 11/07/2019 6.7  % Final   ??? Eosinophils % 11/07/2019 1.3  % Final   ??? Basophils % 11/07/2019 0.5  % Final   ??? Absolute Neutrophils 11/07/2019 3.9  2.0 - 7.5 10*9/L Final   ??? Absolute Lymphocytes 11/07/2019 1.3* 1.5 - 5.0 10*9/L Final   ??? Absolute Monocytes 11/07/2019 0.4  0.2 - 0.8 10*9/L Final   ??? Absolute Eosinophils 11/07/2019 0.1  0.0 - 0.4 10*9/L Final   ??? Absolute Basophils 11/07/2019 0.0  0.0 - 0.1 10*9/L Final   ??? Large Unstained Cells 11/07/2019 2  0 - 4 % Final

## 2019-11-07 NOTE — Unmapped (Signed)
1250: .Patient present for TCHP infusions, no acute concerns reported. Pre-meds administered. Patient comfortable with family present and no requests at this time.    1325: IV fluids and Emend in progress.    1405: Infusion complete with no reaction noted. Pertuzumab in progress; will continue to monitor.     1515: Infusion complete with no reaction noted. Trastuzumab in progress; will continue to monitor.     1650: Infusion complete with no reaction noted. Docetaxel in progress; will continue to monitor.     1755: Infusion complete with no reaction noted. Carboplatin in progress; will continue to monitor.     1830: Patient treatment complete, no reaction noted. Line care performed per protocol and port de-accessed. Patient discharged home alert, oriented and in stable condition with family present.

## 2019-11-07 NOTE — Unmapped (Signed)
Port accessed.  Labs collected & sent for analysis.  To next appt.  Care provided by Moishe Spice RN.

## 2019-11-07 NOTE — Unmapped (Signed)
Breast Cancer Return Patient Evaluation:     Referring Physician:   Vilma Prader, Md  [address]  [facility] [place],  [place] [address]. [phone]    PCP: Suzy Bouchard, MD    Oncology Treatment Team:   Medical Oncologist: Dr. Royal Hawthorn  Surgical Oncologist: Dr. Sherilyn Cooter   Radiation Oncologist: Dr. 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   -- Will need adjuvant HER2 directed therapy (trastuzumab vs TDM1 pending residual disease) to total 1 year of therapy   -- Will need adjuvant anti-estrogen therapy, specifically aromatase inhibitor.     > Port placement 11/01/19  > Baseline ECHO 11/01/19 EF 60-65%       Current therapy:   -- Due for C1 TCHP.  Labs reviewed and adequate for treatment.    -- Reviewed chemo plan/scheduling, common side effects, supportive meds, and when/how to report symptoms.  Reviewed neutropenic precautions, including reporting any fevers 100.5 F or greater.     > some skin reaction to her port dressing; have asked clinic nurses to take a look and change dressing; will send in EMLA cream for future port access also.     Cardiotoxicity drug monitoring:   -- Baseline ECHO 11/01/19 EF 60-65%   -- Plan to repeat ECHO q57m while on HER2 directed therapy; next ECHO due in 01/2020.             DISPO:   -- RTC in 3 weeks for f/u with labs, MD/APP visit, and C2 TCHP.       > For C3, will schedule for Wed 9/8 to accommodate Labor Day holiday on a Monday. (I am out of office on Fri 9/3, so cannot see her in HBO) ; requested appt for C3 on 9/8 today   > Planned beach trip 10/10-10/13.     I personally spent 45 minutes face-to-face and non-face-to-face in the care of this patient, which includes all pre, intra, and post visit time on the date of service.    ---------------------------------------------  Interval History:  Robin Spence 64 y.o. female here for f/u for breast cancer.   -- due for start C1 TCHP  -- here today with family  -- overall doing well   -- having some itching to dressing at port site.   -- otherwise ROS mild/stable except as above.     ECOG Performance Status: 0       Breast Cancer History: [name] presents with her sister Agustin Cree today for consultation regarding newly diagnosed R T2N0 HER+ breast cancer for consideration of neoadjuvant chemotherapy. Her diagnostic work-up to date is outlined below. She reports this cancer was screen-detected on a routine annual mammogram and that neither she nor her medical providers was able to palpate any mass at the site.  Otherwise she is feeling in her usual state of health except for mild bruising of the R breast related to her biopsy.      Oncology History Overview Note   Right breast cT2 cN0 IDC, G3, +/+/equivocal, FISH pending      Malignant neoplasm of right breast in female, estrogen receptor positive (CMS-HCC)   09/19/2019 -  Presenting Symptoms    Abnormal  screening MMG: new 1.9 cm asymmetric density in right UOQ     10/10/2019 Interval Scan(s)    Right diagnostic MMG with tomo: 2.2 cm spiculated mass, right UOQ with architectural distortion   Right [place]: 10:00, 4 CFN, 1.3 x 1.2 x 0.9 cm irregular, not circumscribed spiculated mass     10/12/2019 Biopsy    Right USG core, 10:00: IDC, G3, associated DCIS, G3, ER+ (100%), PR+ (5%), HER2 equivocal (2+), FISH amplified     10/12/2019 Initial Diagnosis    Malignant neoplasm of right breast in female, estrogen receptor positive (CMS-HCC)     10/19/2019 Tumor Board    MDC recs: Right cT2 N0 IDC, G3, +/+/E, FISH pending.  1. Surgery first if FISH non-amplified (later clarified + and referred to [facility] for NACT)  2. Good BCT candidate  3. Consider bracketed wire to localize calcs, like DCIS     10/19/2019 - Cancer Staged    Staging form: Breast, AJCC 8th Edition  - Clinical stage from 10/19/2019: Stage IIA (cT2, cN0, cM0, G3, ER+, PR+, HER2: Equivocal) - Signed by Talbert Cage, DO on 10/19/2019       11/07/2019 -  Chemotherapy    OP BREAST DOCETAXEL/CARBOPLATIN/TRASTUZUMAB/PERTUZUMAB  trastuzumab (8mg /kg load,6mg /kg maint) IV on day 1, pertuzumab (840 mg load, 420 mg maint) IV on day 1, DOCEtaxel 75 mg/m2 IV on day 1, CARBOplatin AUC 6 IV on day 1, pegfilgrastim OBI on day 1, every 3 weeks         Reproductive History: She is a G0P0. She had menarche at the age of 89, and is postmenopausal as of age 43. She has not been on systemic HRT but was previously using vaginal estrogen.      Social History     Social History Narrative    Lives independently in [place]. Previously worked as a Lawyer. Enjoys gardening, watching golf, singing with her church, art projects Information systems manager). Never smoker, 2 alcoholic drinks a week.       Physical Examination:  Vital Signs: BP 148/85  - Pulse 78  - Temp 36.6 ??C (97.8 ??F) (Temporal)  - Resp 18  - Wt 62.5 kg (137 lb 12.8 oz)  - SpO2 98%  - BMI 22.25 kg/m??     GENERAL: Well appearing female in no acute distress.  HEENT: Sclerae anicteric. OP exam deferred given COVID precautions; wearing mask.   NECK: Supple. No cervical or supraclavicular adenopathy.   SKIN: (L) chest wall port with ++ecchymosis and skin erythema (allergic reaction to Tegaderm; nursing staff helped evaluate and change dressing with improvement).  No e/o infection to port site incisions; healing.   LUNGS: Clear to auscultation bilat; breathing non-labored.  CARDIOVASCULAR: Regular rate and rhythm. No LE edema.   GI: Soft, non-tender, normoactive bowel sounds.   EXTREMITIES: Good shoulder ROM bilat. No UE lymphedema.   MSK: No focal areas of bone tenderness to vertebral bodies on palpation.   NEURO/PSYCH: No motor abnormalities noted; gait/coordination normal. No focal deficits. Alert & oriented; appropriate mood and affect.   CHEST/BREASTS: (R) breast at 9:00 with healing bx stigmata; fullness at 10:00 position without palpable mass. (L) breast benign.  No axillary adenopathy bilat.         Pertinent labs/imaging/pathology reviewed in detail.

## 2019-11-10 NOTE — Unmapped (Signed)
Called to check on status after chemo. She reports a headache x 3 days around forehead area bit better today. Eating and drinking without nausea. Had one loose BM today so took Iodium and has had no more. Has been walking in the evening since cooler then. Encouraged her to call re any questions and or concerns.

## 2019-11-13 ENCOUNTER — Emergency Department
Admit: 2019-11-13 | Discharge: 2019-11-14 | Disposition: A | Discharge status: Home / Self Care | Payer: MEDICARE | Attending: Emergency Medicine

## 2019-11-13 ENCOUNTER — Ambulatory Visit
Admit: 2019-11-13 | Discharge: 2019-11-14 | Disposition: A | Discharge status: Home / Self Care | Payer: MEDICARE | Attending: Emergency Medicine

## 2019-11-13 DIAGNOSIS — R509 Fever, unspecified: Principal | ICD-10-CM

## 2019-11-13 DIAGNOSIS — Z170 Estrogen receptor positive status [ER+]: Principal | ICD-10-CM

## 2019-11-13 DIAGNOSIS — C50911 Malignant neoplasm of unspecified site of right female breast: Principal | ICD-10-CM

## 2019-11-13 LAB — URINALYSIS WITH CULTURE REFLEX
GLUCOSE UA: NEGATIVE
KETONES UA: NEGATIVE
LEUKOCYTE ESTERASE UA: NEGATIVE
NITRITE UA: NEGATIVE
PH UA: 7 (ref 5.0–9.0)
PROTEIN UA: NEGATIVE
SPECIFIC GRAVITY UA: 1.008 (ref 1.003–1.030)
SQUAMOUS EPITHELIAL: 7 /HPF — ABNORMAL HIGH (ref 0–5)
UROBILINOGEN UA: 0.2
WBC UA: 3 /HPF (ref 0–5)

## 2019-11-13 LAB — MANUAL DIFFERENTIAL
BASOPHILS - REL (DIFF): 0 %
EOSINOPHILS - ABS (DIFF): 0.1 10*9/L (ref 0.0–0.4)
EOSINOPHILS - REL (DIFF): 1 %
LYMPHOCYTES - ABS (DIFF): 3 10*9/L (ref 1.5–5.0)
LYMPHOCYTES - REL (DIFF): 30 %
MONOCYTES - ABS (DIFF): 0.9 10*9/L — ABNORMAL HIGH (ref 0.2–0.8)
MONOCYTES - REL (DIFF): 9 %
NEUTROPHILS - ABS (DIFF): 6 10*9/L (ref 2.0–7.5)

## 2019-11-13 LAB — COMPREHENSIVE METABOLIC PANEL
ALBUMIN: 4.1 g/dL (ref 3.4–5.0)
ALT (SGPT): 31 U/L (ref 10–49)
ANION GAP: 7 mmol/L (ref 5–14)
BILIRUBIN TOTAL: 0.3 mg/dL (ref 0.3–1.2)
BLOOD UREA NITROGEN: 13 mg/dL (ref 9–23)
BUN / CREAT RATIO: 17
CALCIUM: 9.7 mg/dL (ref 8.7–10.4)
CHLORIDE: 105 mmol/L (ref 98–107)
CO2: 26 mmol/L (ref 20.0–31.0)
CREATININE: 0.76 mg/dL
EGFR CKD-EPI AA FEMALE: >90 mL/min/{1.73_m2} (ref >=60)
EGFR CKD-EPI NON-AA FEMALE: 82 mL/min/{1.73_m2} (ref >=60)
GLUCOSE RANDOM: 123 mg/dL (ref 70–179)
POTASSIUM: 4 mmol/L (ref 3.4–4.5)
PROTEIN TOTAL: 7.3 g/dL (ref 5.7–8.2)
SODIUM: 138 mmol/L (ref 135–145)

## 2019-11-13 LAB — CBC W/ AUTO DIFF
HEMATOCRIT: 43.4 % (ref 36.0–46.0)
HEMOGLOBIN: 14.1 g/dL (ref 12.0–16.0)
MEAN CORPUSCULAR HEMOGLOBIN CONC: 32.4 g/dL (ref 31.0–37.0)
MEAN CORPUSCULAR VOLUME: 94.2 fL (ref 80.0–100.0)
NUCLEATED RED BLOOD CELLS: 0 /100{WBCs} (ref <=4)
PLATELET COUNT: 182 10*9/L (ref 150–440)
RED BLOOD CELL COUNT: 4.61 10*12/L (ref 4.00–5.20)
RED CELL DISTRIBUTION WIDTH: 12.7 % (ref 12.0–15.0)

## 2019-11-13 LAB — PLATELET COUNT: Platelets:NCnc:Pt:Bld:Qn:Automated count: 182

## 2019-11-13 LAB — ANION GAP: Anion gap 3:SCnc:Pt:Ser/Plas:Qn:: 7

## 2019-11-13 LAB — BLOOD UA: Hemoglobin:PrThr:Pt:Urine:Ord:Test strip: NEGATIVE

## 2019-11-13 LAB — LYMPHOCYTES - ABS (DIFF): Lymphocytes:NCnc:Pt:Bld:Qn:: 3

## 2019-11-13 LAB — LACTATE BLOOD VENOUS: Lactate:SCnc:Pt:BldV:Qn:: 1.7

## 2019-11-13 MED ADMIN — famotidine (PEPCID) tablet 20 mg: 20 mg | ORAL | @ 23:00:00 | Stop: 2019-11-13

## 2019-11-13 NOTE — Unmapped (Signed)
Hem/Onc Phone Triage Note    Caller: patient    Reason for Call:   Robin Spence is a 64 y.o. woman with stage II triple positive breast cancer who just initiated [facility] chemotherapy last week (C1D1 7/26) who now has a fever to 100.8. She overall feels ok, just a bit tired. No cough, no dyspnea, no chest pain. No abdominal pain, mild nausea yesterday, resolved with zofran. One loose stool this morning, took immodium. Mild headache after the zofran. Low back pain, mild, present for many years. No urinary symptoms.    Assessment/Plan:   Reviewed that febrile neutropenia is indeed a relatively common occurrence with her chemo regimen [facility] Sane, [facility] 2017 estimates the risk at >20%) and thus advised her to present to the ER for labs and further evaluation.    I recommend:  -vital signs, good physical exam including evaluation of port site  -CBC with Diff, CMP, lactate  -Blood cultures, UA, urine culture  -CXR    If neutropenic, needs hospital admission with IV antibiotics including anti-pseudomonal coverage (favor cefepime).    If not neutropenic, hospital admission and/or antibiotics are at the discretion of the treating provider.      Please page Oncology Consults at [phone] if patient needs admission or questions about care occur.     Fellow Taking Call:  Donzetta Sprung  November 13, 2019 4:12 PM

## 2019-11-14 NOTE — Unmapped (Signed)
Patient temp at home was 100.14F. Had first chemo treatment on Monday, so she's been checking daily. Hx of breast cancer. Patient denies cough, SOB, N/V/D.

## 2019-11-14 NOTE — Unmapped (Signed)
Called Robin Spence to checkin with her re any fever today. She reports no fever. Eating, drinking fluids and has no complaints. Informed her urine culture negative. Awaiting blood culture results. She verbally expresses appreciation for the call.

## 2019-11-14 NOTE — Unmapped (Signed)
I received sign out from previous provider Dr. Shonna Chock.     ED I-PASS Handoff  ?? Illness Severit: stable  ?? Patient Summary: Robin Spence is a 64 y.o. female with a past medical history of breast cancer who started chemotherapy this past Monday (7/26) presenting to the emergency department for evaluation of 1 day of fever (T-max 100.8 ??F).  Patient is also been complaining of some mild diarrhea.  Otherwise she has been asymptomatic.  Vital signs initially remarkable for mild tachycardia but otherwise hemodynamically stable.  Patient's labs thus far have been unremarkable.  Chest x-ray negative.  ANC is still pending.  Plans to discuss with oncology after labs result to determine disposition.  ?? Action List: Follow-up with oncology, follow-up CBC with differential  ?? Situation Awareness (Contingency Planning): Anticipate admission. If oncology signed off stating that patient can be discharged, then discharge    Assessment & Plan:    8:20 PM: I spoke with the oncology fellow on call.  He states that given that she is not neutropenic and her work-up is thus far been unremarkable, he does not see any indication at this time for admission.  I went to reassess the patient and she feels comfortable being discharged.  White count and ANC are within normal limits.  Her vital signs remained stable here in the emergency department and she is afebrile.  Recommended that she call the hematology/oncology on-call line if she develops fever greater than 100.4 ??F.  At this time she stable for discharge.  She voiced understanding of plan is amenable to discharge.  Strict return precautions reviewed.

## 2019-11-14 NOTE — Unmapped (Signed)
Oncology Treatment Plan Note    Robin Spence is a 64 y.o. woman with stage II triple positive breast cancer who just initiated TCHP chemotherapy last week (C1D1 7/26) who reported to the ED this evening after developing a fever to 100.8 at home. Her work-up in the ED has been unremarkable for an infectious source and she has been afebrile since arrival. She was instructed to come to the ED given the risk of neutropenic fever with her current chemotherapy regimen, however her ANC was normal on labs.    I was contacted by the ED team regarding the safety of discharging Robin Spence given her fever at home and recent chemotherapy initiation. I discussed that while fever in the absence of neutropenia without clear source in a patient on chemotherapy does not always necessitate admission, decisions regarding the appropriateness of discharge are at the discretion of the providers currently evaluating the patient. I have not personally seen or talked with this patient. I recommended that if Robin Spence was to be discharged, she should be provided strict return parameters.    Chuck Hint, MD  Hematology & Oncology Fellow, PGY-4

## 2019-11-14 NOTE — Unmapped (Signed)
[facility] Emergency Department Provider Note    ED Course, Assessment and Plan     Initial Clinical Impression:    November 13, 2019 6:44 PM     Robin Spence is a 64 y.o. female with a significant PMH of breast cancer, GERD, depression, and  hypercholesterolemia who presents for evaluation of 1 day of fever (Tmax = 100.8 F), epistaxis relieved with pressure after 10 minutes, and headache in the setting of her first chemotherapy session for newly diagnosed breast cancer 6 days ago.     BP 129/73  - Pulse 86  - Temp 37.1 ??C (98.8 ??F) (Temporal)  - Resp 16  - SpO2 99%     On exam patient is non-toxic appearing and in no acute distress. No active bleeding or foreign body in the nares. Oropharynx is clear with no exudates or posterior erythema. No nuchal rigidity. Heart rate and rhythm regular. No murmur. Lungs clear to auscultation bilaterally. Abdomen soft and non tender. No guarding or rebound. There is a diffuse urticarial rash surrounding her port site however the port site itself is not erythematous, and this erythema seems related to the tape she had on rather than an infection.    MDM (differential diagnosis, diagnostic testing, treatment, and disposition):    Patient presenting in setting of recently starting chemotherapy with low-grade fever, otherwise well-appearing with no complaints other than an episode of diarrhea and vague mild headache.  Concern for meningitis is very low she is well-appearing, stable vital signs, no nuchal rigidity and her headache was transient and now mostly resolved.  Given she is receiving chemotherapy and do not know her neutrophil count will give dose of IV antibiotics Vanco and cefepime blood cultures, basic labs, urine, chest x-ray.  If any abnormalities low threshold for admission.  We will plan to discuss case with her oncology team and see if they recommend admission versus discharge if labs are normal given she otherwise feels quite well.      ED Course:    Care signed out to oncoming resident at 7:00 PM, pending results of diagnostic testing and reaching out to oncology.    _____________________________________________________________________    The case was discussed with attending physician who is in agreement with the above assessment and plan    Additional Medical Decision Making     I have reviewed the vital signs and the nursing notes. Diagnostic studies including labs, EKG, and radiology results that were available during my care of the patient were independently reviewed by me and considered in my medical decision making.   I independently visualized the radiology images   I reviewed the patient's prior medical records if available.  Additional history obtained from family if available    History     CHIEF COMPLAINT:   Chief Complaint   Patient presents with   ??? Fever Immunocompromised     HPI: Robin Spence is a 64 y.o. female with a PMH of breast cancer, GERD, depression, and  hypercholesterolemia who presents for evaluation of a fever while immunocompromised. The patient was diagnosed with breast cancer on 10/19/19 and had her first chemotherapy session 6 days ago. Today, she experienced onset of a fever (Tmax = 100.8 F) and bloody nose which was relieved with pressure after 10 minutes. The patient also endorses occasional headaches, which were more significant last night, and one episode of non-bloody diarrhea this afternoon. Of note, the patient currently has a rash surrounding her port site from an  allergic reaction to the dressing. She notes some lower back muscle spasms, but states that this is not unusual for her. She is fully vaccinated against COVID, and received the second Pfizer vaccine on 07/06/19. The patient denies shortness of breath, cough, congestion, dysuria, or new rashes.     PAST MEDICAL HISTORY/PAST SURGICAL HISTORY:   Past Medical History:   Diagnosis Date   ??? Malignant neoplasm of right breast in female, estrogen receptor positive (CMS-HCC) 10/18/2019       Past Surgical History:   Procedure Laterality Date   ??? BREAST BIOPSY Right 10/12/2019   ??? IR INSERT PORT AGE GREATER THAN 5 YRS  11/01/2019    IR INSERT PORT AGE GREATER THAN 5 YRS 11/01/2019 Maree Erie, MD IMG VIR [facility]       MEDICATIONS:   No current facility-administered medications for this encounter.    Current Outpatient Medications:   ???  citalopram (CELEXA) 20 MG tablet, Take 20 mg by mouth daily., Disp: , Rfl:   ???  cyanocobalamin, vitamin B-12, 1000 MCG tablet, Take 1 tablet by mouth Take as directed., Disp: , Rfl:   ???  estradioL (ESTRACE) 0.01 % (0.1 mg/gram) vaginal cream, Insert 2 g into the vagina Two (2) times a week. Compound product reported from patient-unsure concentration. Pt used twice a week, Disp: , Rfl:   ???  famotidine (PEPCID) 40 MG tablet, Take 40 mg by mouth Take as directed., Disp: , Rfl:   ???  lidocaine-prilocaine (EMLA) 2.5-2.5 % cream, Apply topically to port site 1 hour prior to port access. Cover with plastic wrap., Disp: 30 g, Rfl: 1  ???  melatonin 1 mg Tab tablet, Take 1 tablet by mouth at bedtime as needed., Disp: , Rfl:   ???  metroNIDAZOLE (METROGEL) 0.75 % gel, Apply 1 application topically Take as directed., Disp: , Rfl:   ???  ondansetron (ZOFRAN) 8 MG tablet, Take 1 tablet (8 mg total) by mouth every eight (8) hours as needed for nausea (or vomiting)., Disp: 30 tablet, Rfl: 2  ???  prochlorperazine (COMPAZINE) 10 MG tablet, Take 1 tablet (10 mg total) by mouth every six (6) hours as needed (nausea or vomitting)., Disp: 30 tablet, Rfl: 2  ???  tretinoin (RETIN-A) 0.025 % gel, Apply 1 application topically Take as directed., Disp: , Rfl:     ALLERGIES:   Sulfa (sulfonamide antibiotics) and Tetanus vaccines and toxoid    SOCIAL HISTORY:   Social History     Tobacco Use   ??? Smoking status: Never Smoker   ??? Smokeless tobacco: Never Used   Substance Use Topics   ??? Alcohol use: Yes     Alcohol/week: 2.0 standard drinks     Types: 2 Glasses of wine per week       FAMILY HISTORY:  Family History   Problem Relation Age of Onset   ??? Leukemia Mother    ??? Breast cancer Neg Hx       Review of Systems    A 10 point review of systems was performed and is negative other than positive elements noted in HPI     Constitutional: Positive for fever.   Eyes: Negative for visual changes.  ENT: Positive for nosebleed. Negative for sore throat.  Cardiovascular: No chest pain or palpitations.  Respiratory: Negative for shortness of breath, cough, or wheezing.  Gastrointestinal: Positive for diarrhea. Negative for abdominal pain or diarrhea.   Genitourinary: Negative for dysuria.  Musculoskeletal: Positive for back pain.  Skin: Positive  for rash.  Neurological: Positive for headache. Negative for focal weakness or numbness.    Physical Exam     VITAL SIGNS:    BP 129/73  - Pulse 86  - Temp 37.1 ??C (98.8 ??F) (Temporal)  - Resp 16  - SpO2 99%     Constitutional: Alert and oriented. Nontoxic appearing and in no distress. No increased respiratory effort.   Eyes: Conjunctivae are normal. PERRL  ENT       Head: Normocephalic and atraumatic.       Nose: No congestion. No active bleeding or foreign body in the nares.        Mouth/Throat: Mucous membranes are moist. Oropharynx is clear with no exudates or posterior erythema.        Neck: No stridor. No nuchal rigidity.  Hematological/Lymphatic/Immunilogical: No cervical lymphadenopathy.  Cardiovascular: S1, S2,  Normal and symmetric distal pulses are present in all extremities.Warm and well perfused. No murmur.   Respiratory: Normal respiratory effort. Breath sounds are normal.  Gastrointestinal: Soft and nontender. There is no CVA tenderness.  Musculoskeletal: Grossly normal range of motion in all extremities.       Right lower leg: No tenderness or edema.       Left lower leg: No tenderness or edema.  Neurologic: Normal speech and language. No gross focal neurologic deficits are appreciated. No gross asymmetrical weakness   Skin: Skin is warm, dry and intact.  Faint erythema where adhesive report bandage previously was, no erythema over port itself, nontender, no fluctuance or purulent drainage  Psychiatric: Mood and affect are normal. Speech and behavior are normal.    Radiology     XR Chest Portable   Preliminary Result      No acute airspace disease.        Labs     Labs Reviewed   COMPREHENSIVE METABOLIC PANEL - Abnormal; Notable for the following components:       Result Value    AST 39 (*)     All other components within normal limits   URINALYSIS WITH CULTURE REFLEX - Abnormal; Notable for the following components:    Squam Epithel, UA 7 (*)     Bacteria, UA Rare (*)     All other components within normal limits   CBC W/ AUTO DIFF - Abnormal; Notable for the following components:    Neutrophil Left Shift 2+ (*)     All other components within normal limits    Narrative:     Please use the Absolute Differential for reference ranges.    MANUAL DIFFERENTIAL - Abnormal; Notable for the following components:    Absolute Monocytes 0.9 (*)     Dohle Bodies Present (*)     Toxic Granulation Present (*)     Toxic Vacuolation Present (*)     All other components within normal limits   RESPIRATORY PATHOGEN PANEL WITH COVID - Normal    Narrative:     This result was obtained using the FDA-cleared Bio[employer] Respiratory 2.1 Panel. Performance characteristics have been established and verified by the Clinical Molecular Microbiology Laboratory, [facility]. This assay does not distinguish between rhinovirus and enterovirus. Lower respiratory specimens will not be tested for Bordetella pertussis/parapertussis. For nasopharyngeal swabs, cross-reactivity may occur between B. pertussis and non-pertussis Bordetella species. All positive B. pertussis results will be automatically confirmed using our in-house PCR assay. Ct (cycle threshold) values for SARS-CoV-2 are not available for this test.   LACTATE, VENOUS, WHOLE BLOOD - Normal  BLOOD CULTURE   BLOOD CULTURE   URINE CULTURE   CBC W/ DIFFERENTIAL    Narrative:     The following orders were created for panel order CBC w/ Differential.  Procedure                               Abnormality         Status                     ---------                               -----------         ------                     CBC w/ Differential[[id number]]         Abnormal            Final result               Manual Differential[[id number]]         Abnormal            Final result                 Please view results for these tests on the individual orders.         Pertinent labs & imaging results that were available during my care of the patient were reviewed by me and considered in my medical decision making (see chart for details).    Please note- This chart has been created using [employer]. Chart creation errors have been sought, but may not always be located and such creation errors, especially pronoun confusion, do NOT reflect on the standard of medical care.      Leta Speller, MD    _____________________________________________________  Documentation assistance was provided by Bryna Colander, Scribe on November 13, 2019 at 6:44 PM for Leta Speller, MD.     Documentation assistance was provided by the scribe in my presence.  The documentation recorded by the scribe has been reviewed by me and accurately reflects the services I personally performed.     Leta Speller, MD  Resident  11/13/19 [phone]

## 2019-11-15 NOTE — Unmapped (Signed)
Blood Culture    Order: [id number] and Order: [id number]  Status:  Preliminary result ????    No Growth at 24 hours

## 2019-11-16 NOTE — Unmapped (Signed)
Blood Culture ??  Order: [id number] and Order: [id number]  Status: ??Preliminary result ????  ??  No Growth at 48 hours

## 2019-11-17 NOTE — Unmapped (Signed)
Blood Culture ??  Order: [id number] and Order: [id number]  Status: ??Preliminary result ????  ??  No Growth at 72 hours

## 2019-11-18 NOTE — Unmapped (Signed)
Blood Culture ??  Order: [id number] and Order: [id number]  Status: ??Preliminary result ????  ??  No Growth at 4 days

## 2019-11-19 NOTE — Unmapped (Signed)
Blood Culture ??  Order: [id number] and Order: [id number]  Status: ??Final result ????  ??  No Growth at 5 days

## 2019-11-23 NOTE — Unmapped (Signed)
[facility] Clinical Assessment & Refill Coordination Note    Robin Spence, DOB: [birth year]  Phone: [phone] (home)     All above HIPAA information was verified with patient.     Was a Nurse, learning disability used for this call? No    Specialty Medication(s):   Hematology/Oncology: Ziextenzo     Current Outpatient Medications   Medication Sig Dispense Refill   ??? citalopram (CELEXA) 20 MG tablet Take 20 mg by mouth daily.     ??? cyanocobalamin, vitamin B-12, 1000 MCG tablet Take 1 tablet by mouth Take as directed.     ??? estradioL (ESTRACE) 0.01 % (0.1 mg/gram) vaginal cream Insert 2 g into the vagina Two (2) times a week. Compound product reported from patient-unsure concentration. Pt used twice a week     ??? famotidine (PEPCID) 40 MG tablet Take 40 mg by mouth Take as directed.     ??? lidocaine-prilocaine (EMLA) 2.5-2.5 % cream Apply topically to port site 1 hour prior to port access. Cover with plastic wrap. 30 g 1   ??? melatonin 1 mg Tab tablet Take 1 tablet by mouth at bedtime as needed.     ??? metroNIDAZOLE (METROGEL) 0.75 % gel Apply 1 application topically Take as directed.     ??? ondansetron (ZOFRAN) 8 MG tablet Take 1 tablet (8 mg total) by mouth every eight (8) hours as needed for nausea (or vomiting). 30 tablet 2   ??? pegfilgrastim-bmez (ZIEXTENZO) 6 mg/0.6 mL injection Inject the content of one syringe (6 mg total) under the skin once 24 hours after chemotherapy every 21 days. 0.6 mL 5   ??? prochlorperazine (COMPAZINE) 10 MG tablet Take 1 tablet (10 mg total) by mouth every six (6) hours as needed (nausea or vomitting). 30 tablet 2   ??? tretinoin (RETIN-A) 0.025 % gel Apply 1 application topically Take as directed.       No current facility-administered medications for this visit.        Changes to medications: [name] reports no changes at this time.    Allergies   Allergen Reactions   ??? Sulfa (Sulfonamide Antibiotics) Hives   ??? Tetanus Vaccines And Toxoid Other (See Comments)     As a child - pt does not remember       Changes to allergies: No    SPECIALTY MEDICATION ADHERENCE     Ziextenzo 6 mg/0.101ml: 0 days of medicine on hand     Medication Adherence    Patient reported X missed doses in the last month: 0  Specialty Medication: Ziextenzo           Specialty medication(s) dose(s) confirmed: Regimen is correct and unchanged.     Are there any concerns with adherence? No    Adherence counseling provided? Not needed    CLINICAL MANAGEMENT AND INTERVENTION      Clinical Benefit Assessment:    Do you feel the medicine is effective or helping your condition? Yes    Clinical Benefit counseling provided? Not needed    Adverse Effects Assessment:    Are you experiencing any side effects? No    Are you experiencing difficulty administering your medicine? No    Quality of Life Assessment:    How many days over the past month did your condition/medication  keep you from your normal activities? For example, brushing your teeth or getting up in the morning. 0    Have you discussed this with your provider? Not needed  Therapy Appropriateness:    Is therapy appropriate? Yes, therapy is appropriate and should be continued    DISEASE/MEDICATION-SPECIFIC INFORMATION      For patients on injectable medications: Patient currently has 0 doses left.  Next injection is scheduled for 11/29/19.    PATIENT SPECIFIC NEEDS     - Does the patient have any physical, cognitive, or cultural barriers? No    - Is the patient high risk? No    - Does the patient require a Care Management Plan? No     - Does the patient require physician intervention or other additional services (i.e. nutrition, smoking cessation, social work)? No      SHIPPING     Specialty Medication(s) to be Shipped:   Hematology/Oncology: Ziextenzo    Other medication(s) to be shipped: No additional medications requested for fill at this time     Changes to insurance: No    Delivery Scheduled: Yes, Expected medication delivery date: 11/25/19.     Medication will be delivered via Same Day Courier to the confirmed prescription address in [facility].    The patient will receive a drug information handout for each medication shipped and additional FDA Medication Guides as required.  Verified that patient has previously received a Conservation officer, historic buildings.    All of the patient's questions and concerns have been addressed.    Rollen Sox   [facility] Specialty Pharmacist

## 2019-11-23 NOTE — Unmapped (Signed)
Breast Cancer Return Patient Evaluation:     Referring Physician:   Vilma Prader, Md  [address]  [facility] [place],  [place] [address]. [phone]    PCP: Marisue Ivan, MD    Oncology Treatment Team:   Medical Oncologist: Dr. Royal Hawthorn  Surgical Oncologist: Dr. Sherilyn Cooter   Radiation Oncologist: Dr. 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   -- Will need adjuvant HER2 directed therapy (trastuzumab vs TDM1 pending residual disease) to total 1 year of therapy   -- Will need adjuvant anti-estrogen therapy, specifically aromatase inhibitor.     > Port placement 11/01/19  > Baseline ECHO 11/01/19 EF 60-65%       Current therapy:   -- Due for C2 TCHP.  Overall, tolerated C1 generally well.  Did had fever 100.8 a few days post-growth factor. No infectious etiology identified; fevers resolved and did not recur.  Had some HAs; unclear if r/t Zofran or growth factor/skull pain. We talk about Sancuso as an option instead of Zofran, but the HA was mild and she'd like to try PRN Tylenol/ibuprofen with this cycle. Provided reassurance the PRN Tylenol/ibuprofen ok.   -- Labs reviewed and adequate for treatment.    -- Self-injecting growth factor (Ziextenzo) post-chemo.  If she has recurrent fevers and or leukocytosis (WBCs/neutrophils normal today), then could consider given 1/2 dose Ziextenzo with future cycles.         Cardiotoxicity drug monitoring:   -- Baseline ECHO 11/01/19 EF 60-65%   -- Plan to repeat ECHO q62m while on HER2 directed therapy; next ECHO due in 01/2020.       Supportive Care:   > Vaginal yeast infection: Noted with C1 and self-treated with OTC Monistat with relief.   > Headaches: May be d/t Zofran vs growth factor. Could try Sancuso, but she said it was mild and could probably be managed with Tylenol/ibuprofen (she didn't take for fear of masking a fever).             DISPO:   -- RTC in 3 weeks for f/u with labs, MD/APP visit, and C3 TCHP.       > For C3, will see Dr. Darrol Angel on Wed 9/8 to accommodate Labor Day holiday on a Monday.     > Planned beach trip 10/10-10/13.     [scheduled orders for labs/MD visit/infusion requested for remainder of chemo course]      ---------------------------------------------  Interval History:  Robin Spence 64 y.o. female here for f/u for breast cancer.   -- due for C2 TCHP  -- had a mild HA for ~10 days post-chemo; we talk about this may have been d/t Zofran vs growth factor   -- had a fever to 100.8 post-chemo; went to ED and eval was normal.   -- for 4 days post-inj (days 4-8) was when her sx were worst   -- no taste changes, but was just snacking; didn't feel like fixing meals  -- no bony pains; took Claritin but did have dry nose and some bloody noses as a result; using nasal gel to help with moisturizers.   -- questions about monitoring fevers at home; can she wait 1-2 hours  to see if it improves before calling/coming in; I told her I think this is reasonable, as long as she is feeling well without sx of infection at the time     -- appetite good  -- going back and forth between constipation/diarrhea for first few days; manageable with Imodium   -- did have vaginal yeast infection which she self-treated with Monistat which cleared it up. Now eating yogurt.   --  had coating on tongue that improved with eating yogurt; no burning or pain; no ulcers (using salt water rinses)  -- some acne changes to face  -- questions about soy foods  -- using Boost/Ensure protein shakes   -- otherwise ROS mild/stable except as above.       ECOG Performance Status: 0       Breast Cancer History: Robin Spence presents with her sister Robin Spence today for consultation regarding newly diagnosed R T2N0 HER+ breast cancer for consideration of neoadjuvant chemotherapy. Her diagnostic work-up to date is outlined below. She reports this cancer was screen-detected on a routine annual mammogram and that neither she nor her medical providers was able to palpate any mass at the site.  Otherwise she is feeling in her usual state of health except for mild bruising of the R breast related to her biopsy.      Oncology History Overview Note   Right breast cT2 cN0 IDC, G3, +/+/equivocal, FISH pending      Malignant neoplasm of right breast in female, estrogen receptor positive (CMS-HCC)   09/19/2019 -  Presenting Symptoms    Abnormal screening MMG: new 1.9 cm asymmetric density in right UOQ     10/10/2019 Interval Scan(s)    Right diagnostic MMG with tomo: 2.2 cm spiculated mass, right UOQ with architectural distortion   Right [place]: 10:00, 4 CFN, 1.3 x 1.2 x 0.9 cm irregular, not circumscribed spiculated mass     10/12/2019 Biopsy    Right USG core, 10:00: IDC, G3, associated DCIS, G3, ER+ (100%), PR+ (5%), HER2 equivocal (2+), FISH amplified     10/12/2019 Initial Diagnosis    Malignant neoplasm of right breast in female, estrogen receptor positive (CMS-HCC)     10/19/2019 Tumor Board    MDC recs: Right cT2 N0 IDC, G3, +/+/E, FISH pending.  1. Surgery first if FISH non-amplified (later clarified + and referred to [facility] for NACT)  2. Good BCT candidate  3. Consider bracketed wire to localize calcs, like DCIS     10/19/2019 -  Cancer Staged    Staging form: Breast, AJCC 8th Edition  - Clinical stage from 10/19/2019: Stage IIA (cT2, cN0, cM0, G3, ER+, PR+, HER2: Equivocal) - Signed by Talbert Cage, DO on 10/19/2019       11/07/2019 -  Chemotherapy    OP BREAST DOCETAXEL/CARBOPLATIN/TRASTUZUMAB/PERTUZUMAB  trastuzumab (8mg /kg load,6mg /kg maint) IV on day 1, pertuzumab (840 mg load, 420 mg maint) IV on day 1, DOCEtaxel 75 mg/m2 IV on day 1, CARBOplatin AUC 6 IV on day 1, pegfilgrastim OBI on day 1, every 3 weeks         Reproductive History: She is a G0P0. She had menarche at the age of 33, and is postmenopausal as of age 78. She has not been on systemic HRT but was previously using vaginal estrogen.      Social History     Social History Narrative    Lives independently in [place]. Previously worked as a Lawyer. Enjoys gardening, watching golf, singing  with her church, art projects Information systems manager). Never smoker, 2 alcoholic drinks a week.       Physical Examination:  Vital Signs: BP 140/66  - Pulse 74  - Temp 36.7 ??C (98 ??F) (Oral)  - Resp 18  - Ht 167.6 cm (5' 6)  - Wt 64 kg (141 lb)  - SpO2 98%  - BMI 22.76 kg/m??     GENERAL: Well appearing female in no acute distress.  HEENT: Sclerae anicteric. OP exam deferred given COVID precautions; wearing mask.   NECK: Supple. No cervical or supraclavicular adenopathy.   SKIN: (L) chest wall port healing; no e/o infection.   LUNGS: Clear to auscultation bilat; breathing non-labored.  CARDIOVASCULAR: Regular rate and rhythm. No LE edema.   GI: Soft, non-tender, normoactive bowel sounds.   EXTREMITIES: Good shoulder ROM bilat. No UE lymphedema.   MSK: No focal areas of bone tenderness to vertebral bodies on palpation.   NEURO/PSYCH: No motor abnormalities noted; gait/coordination normal. No focal deficits. Alert & oriented; appropriate mood and affect.   CHEST/BREASTS: (R) breast at 9:00 with healing bx stigmata; fullness at 10:00 position without palpable mass. (L) breast benign.  No axillary adenopathy bilat.         Pertinent labs/imaging/pathology reviewed in detail.

## 2019-11-25 MED ORDER — ZIEXTENZO 6 MG/0.6 ML SUBCUTANEOUS SYRINGE
0 days
Start: 2019-11-25 — End: ?

## 2019-11-25 MED FILL — ZIEXTENZO 6 MG/0.6 ML SUBCUTANEOUS SYRINGE: SUBCUTANEOUS | 21 days supply | Qty: 0.6 | Fill #1

## 2019-11-25 MED FILL — ZIEXTENZO 6 MG/0.6 ML SUBCUTANEOUS SYRINGE: 21 days supply | Qty: 1 | Fill #1 | Status: AC

## 2019-11-28 ENCOUNTER — Ambulatory Visit: Admit: 2019-11-28 | Discharge: 2019-11-29 | Payer: MEDICARE

## 2019-11-28 ENCOUNTER — Ambulatory Visit: Admit: 2019-11-28 | Discharge: 2019-11-29 | Payer: MEDICARE | Attending: Adult Health | Primary: Adult Health

## 2019-11-28 ENCOUNTER — Other Ambulatory Visit: Admit: 2019-11-28 | Discharge: 2019-11-29 | Payer: MEDICARE

## 2019-11-28 DIAGNOSIS — C50911 Malignant neoplasm of unspecified site of right female breast: Principal | ICD-10-CM

## 2019-11-28 DIAGNOSIS — Z170 Estrogen receptor positive status [ER+]: Principal | ICD-10-CM

## 2019-11-28 LAB — CBC W/ AUTO DIFF
BASOPHILS ABSOLUTE COUNT: 0.1 10*9/L (ref 0.0–0.1)
BASOPHILS RELATIVE PERCENT: 0.7 %
EOSINOPHILS ABSOLUTE COUNT: 0.1 10*9/L (ref 0.0–0.4)
EOSINOPHILS RELATIVE PERCENT: 0.9 %
HEMATOCRIT: 36.3 % (ref 36.0–46.0)
HEMOGLOBIN: 12.1 g/dL (ref 12.0–16.0)
LARGE UNSTAINED CELLS: 2 % (ref 0–4)
LYMPHOCYTES ABSOLUTE COUNT: 1.4 10*9/L — ABNORMAL LOW (ref 1.5–5.0)
LYMPHOCYTES RELATIVE PERCENT: 17.1 %
MEAN CORPUSCULAR HEMOGLOBIN CONC: 33.4 g/dL (ref 31.0–37.0)
MEAN CORPUSCULAR HEMOGLOBIN: 31.3 pg (ref 26.0–34.0)
MEAN CORPUSCULAR VOLUME: 93.9 fL (ref 80.0–100.0)
MEAN PLATELET VOLUME: 7.8 fL (ref 7.0–10.0)
MONOCYTES RELATIVE PERCENT: 6.2 %
NEUTROPHILS ABSOLUTE COUNT: 5.9 10*9/L (ref 2.0–7.5)
NEUTROPHILS RELATIVE PERCENT: 73.2 %
PLATELET COUNT: 433 10*9/L (ref 150–440)
RED BLOOD CELL COUNT: 3.87 10*12/L — ABNORMAL LOW (ref 4.00–5.20)
RED CELL DISTRIBUTION WIDTH: 14.6 % (ref 12.0–15.0)
WBC ADJUSTED: 8.1 10*9/L (ref 4.5–11.0)

## 2019-11-28 LAB — CREATININE: CREATININE: 0.65 mg/dL

## 2019-11-28 LAB — BILIRUBIN TOTAL: Bilirubin:MCnc:Pt:Ser/Plas:Qn:: 0.3

## 2019-11-28 LAB — HEPATIC FUNCTION PANEL
ALKALINE PHOSPHATASE: 72 U/L (ref 46–116)
ALT (SGPT): 19 U/L (ref 10–49)
AST (SGOT): 21 U/L (ref <=34)
BILIRUBIN DIRECT: <0.1 mg/dL (ref 0.00–0.30)
PROTEIN TOTAL: 6.5 g/dL (ref 5.7–8.2)

## 2019-11-28 LAB — EGFR CKD-EPI NON-AA FEMALE
Glomerular filtration rate/1.73 sq M.predicted.non black:ArVRat:Pt:Ser/Plas/Bld:Qn:Creatinine-based formula (CKD-EPI): >90

## 2019-11-28 LAB — MEAN CORPUSCULAR HEMOGLOBIN CONC: Erythrocyte mean corpuscular hemoglobin concentration:MCnc:Pt:RBC:Qn:Automated count: 33.4

## 2019-11-28 MED ADMIN — trastuzumab-anns (KANJINTI) 380 mg in sodium chloride (NS) 0.9 % 250 mL IVPB: 6 mg/kg | INTRAVENOUS | @ 19:00:00 | Stop: 2019-11-28

## 2019-11-28 MED ADMIN — fosaprepitant (EMEND) 150 mg in sodium chloride (NS) 0.9 % 100 mL IVPB: 150 mg | INTRAVENOUS | @ 17:00:00 | Stop: 2019-11-28

## 2019-11-28 MED ADMIN — sodium chloride (NS) 0.9 % infusion: 100 mL/h | INTRAVENOUS | @ 17:00:00

## 2019-11-28 MED ADMIN — CARBOplatin (PARAPLATIN) 621.6 mg IVPB: 621.6 mg | INTRAVENOUS | @ 21:00:00 | Stop: 2019-11-28

## 2019-11-28 MED ADMIN — heparin, porcine (PF) 100 unit/mL injection 500 Units: 500 [IU] | INTRAVENOUS | @ 22:00:00 | Stop: 2019-11-29

## 2019-11-28 MED ADMIN — DOCEtaxel (TAXOTERE) 128.3 mg in sodium chloride 0.9% NON-PVC (NS) 0.9 % 250 mL IVPB: 75 mg/m2 | INTRAVENOUS | @ 20:00:00 | Stop: 2019-11-28

## 2019-11-28 MED ADMIN — pertuzumab (PERJETA) 420 mg in sodium chloride (NS) 0.9 % 250 mL IVPB: 420 mg | INTRAVENOUS | @ 18:00:00 | Stop: 2019-11-28

## 2019-11-28 MED ADMIN — dexAMETHasone (DECADRON) tablet 8 mg: 8 mg | ORAL | @ 17:00:00 | Stop: 2019-11-28

## 2019-11-28 MED ADMIN — ondansetron (ZOFRAN) tablet 24 mg: 24 mg | ORAL | @ 17:00:00 | Stop: 2019-11-28

## 2019-11-28 NOTE — Unmapped (Signed)
Port accessed.  Labs collected & sent for analysis.  To next appt.  Care provided by [facility] RN.

## 2019-11-28 NOTE — Unmapped (Signed)
Labs found to be within parameters for treatment today. Request for drug sent to pharmacy.

## 2019-11-29 NOTE — Unmapped (Signed)
TCHP infused uneventfully via PIV & site remained CDI/intact. Needle removed, applied gauzes, co-band wrapped. AVS declined & Pt d/c in ambulatorily accompanied by friend.

## 2019-11-30 DIAGNOSIS — C50911 Malignant neoplasm of unspecified site of right female breast: Principal | ICD-10-CM

## 2019-11-30 DIAGNOSIS — Z170 Estrogen receptor positive status [ER+]: Principal | ICD-10-CM

## 2019-11-30 MED ORDER — PEGFILGRASTIM-BMEZ 6 MG/0.6 ML SUBCUTANEOUS SYRINGE
Freq: Once | SUBCUTANEOUS | 3 refills | 1 days | Status: CP
Start: 2019-11-30 — End: 2019-11-30

## 2019-12-01 NOTE — Unmapped (Signed)
Called to  See how Robin Spence was managing any symptoms after her C2 TCHP. She informs me she took a 2 mile walk yesterday but really fatigued today. Already taken a nap and does not feel like walking today. Eating and drinking without any nausea. BM yesterday but not today. She informs me the diarrhea will start tomorrow and last for 4 days. We discussed early intervention with the Imodium to try to decrease the stool per day and even number of days.   Proved her my contact number during the day. She verbally expressed appreciation for the call.

## 2019-12-16 NOTE — Unmapped (Addendum)
Ms. Barman is a 64 yo F with breast cancer seen in clinic with Dr. Darrol Angel. I spoke to [facility] by [employer] to follow up on status of pegfilgrastim-bmez (Ziextenzo) prescription.     I was notified that the pharmacy is not able to ship directly to patient's home without permission from providers office. Therefore, it was shipped to hospital address [phone] UPS tracking # provided [phone]) and anticipated date of delivery is 9/3. I let [facility] representative know that this address may not be specific enough for the medication to be routed directly to the outpatient breast oncology clinic and patient will need dose on 9/10. They let me know that if needed, they will be able to process a replacement fill and ship directly to her home overnight so that Ms. Sanagustin has the medication in time. I also provided permission to ship directly to patient's home address in the future.     Will follow-up next week if patient requires replacement fill of Ziextenzo. Phone # for [facility]: [phone]    Time spent: 20 minutes    Ronnald Collum, PharmD  Hematology/Oncology Clinical Pharmacist  Pager [phone]

## 2019-12-20 NOTE — Unmapped (Signed)
Three way call with [facility] and patient to set up new delivery of Ziextenzo.  Pharmacy had shipment sent to hospital main address and this morning UPS still is unclear of day of delivery.  [facility] will mail a new shipment directly to the patient.  Robin Spence consented to delivery on 12/22/19.

## 2019-12-20 NOTE — Unmapped (Signed)
Breast Cancer Return Patient Evaluation:     PCP: Marisue Ivan, MD    Oncology Treatment Team:   Medical Oncologist: Dr. Royal Hawthorn  Surgical Oncologist: Dr. Sherilyn Cooter   Radiation Oncologist: Dr. Rogelio Seen      Assessment/Plan:  64 yo woman with clinical T2N0 +/+/+ right breast cancer undergoing neoadjuvant chemotherapy.    SYSTEMIC THERAPY OF CLINICAL STAGE II TRIPLE POSITIVE CANCER OF THE RIGHT BREAST  -- Plan for neoadjuvant TCHP x 6 cycles  -- Will need surgery after neoadjuvant chemo   -- Will need radiation pending response to chemo and surgical path.   -- Will need adjuvant HER2 directed therapy (trastuzumab vs TDM1 pending residual disease) to total 1 year of therapy   -- Will need adjuvant anti-estrogen therapy, specifically aromatase inhibitor.     Current therapy:   -- Due for C3 TCHP.  Labs acceptable to proceed at full dose.  -- Self-injecting growth factor (Ziextenzo) post-chemo w/ low grade fever after cycle 1 thought to be associated with GF.  If she has recurrent fevers and or leukocytosis, could consider given 1/2 dose Ziextenzo with future cycles.       Cardiotoxicity drug monitoring:   -- Baseline ECHO 11/01/19 EF 60-65%   -- Plan to repeat ECHO q35m while on HER2 directed therapy; next ECHO due ~ 02/01/20, at next appt schedule.    Supportive Care:   > reflux:  Agree with head elevation at night, told her also OK to take OTC pepcid dose in morning during chemo.  > constipation:  Add docusate daily on days taking zofran, add high fiber foods.  Miralax if no BM x 2-3 days.  > rash:  Likely mild contact dermatitis from beanie/wigs or folliculitis associated with hair loss.  For now, shampoo with baby shampoo, leave wig off when possible.  Let us know if worsening, may need to try a medicated shampoo.  > BP:  Explained this may be related to stress, intermittent steroid doses, not acutely harmful but may also just be evolving hypertension associated with aging.  If persistent during trastuzumab maint therapy, institute home monitoring and consult with PCP.  > Planned beach trip 10/10-10/13 between cycles 4 and 5.    DISPO:   -- RTC in 3 weeks (on Monday) for f/u with labs, MD/APP visit, and C4 TCHP.     ---------------------------------------------  Interval History:  Ms. Nugent 64 y.o. female here for f/u for breast cancer.   -- one bad episode of acid reflux last week at night, leading to burning in throat and coughing.  Took tums. Already takes pepcid at night but now has added a wedge pillow.    -- hair mostly out, has scalp rash that was there before the buzzcut she did, itches slightly.  No rash on any area except scalp.  -- constipation while on zofran with cramps/gas, resolves with OTC miralax  -- prior vaginal yeast infection has not recurred  -- one low grade temp last Friday 100.2, no associated symptoms other than feeling slightly chilled, has not returned  -- concerned about higher BP readings (~140) since chemo started than she remembers from measurements over the past few years.  -- otherwise ROS mild/stable except as above.     ECOG Performance Status: 0       Oncology History Overview Note   Right breast cT2 cN0 IDC, G3, +/+/equivocal, FISH pending      Malignant neoplasm of right breast in female, estrogen receptor positive (CMS-HCC)  09/19/2019 -  Presenting Symptoms    Abnormal screening MMG: new 1.9 cm asymmetric density in right UOQ     10/10/2019 Interval Scan(s)    Right diagnostic MMG with tomo: 2.2 cm spiculated mass, right UOQ with architectural distortion   Right [place]: 10:00, 4 CFN, 1.3 x 1.2 x 0.9 cm irregular, not circumscribed spiculated mass     10/12/2019 Biopsy    Right USG core, 10:00: IDC, G3, associated DCIS, G3, ER+ (100%), PR+ (5%), HER2 equivocal (2+), FISH amplified     10/12/2019 Initial Diagnosis    Malignant neoplasm of right breast in female, estrogen receptor positive (CMS-HCC)     10/19/2019 Tumor Board    MDC recs: Right cT2 N0 IDC, G3, +/+/E, FISH pending.  1. Surgery first if FISH non-amplified (later clarified + and referred to [facility] for [facility]CT)  2. Good BCT candidate  3. Consider bracketed wire to localize calcs, like DCIS     10/19/2019 -  Cancer Staged    Staging form: Breast, AJCC 8th Edition  - Clinical stage from 10/19/2019: Stage IIA (cT2, cN0, cM0, G3, ER+, PR+, HER2: Equivocal) - Signed by Talbert Cage, DO on 10/19/2019       11/07/2019 -  Chemotherapy    OP BREAST DOCETAXEL/CARBOPLATIN/TRASTUZUMAB/PERTUZUMAB  trastuzumab (8mg /kg load,6mg /kg maint) IV on day 1, pertuzumab (840 mg load, 420 mg maint) IV on day 1, DOCEtaxel 75 mg/m2 IV on day 1, CARBOplatin AUC 6 IV on day 1, pegfilgrastim OBI on day 1, every 3 weeks         Reproductive History: She is a G0P0. She had menarche at the age of 23, and is postmenopausal as of age 46. She has not been on systemic HRT but was previously using vaginal estrogen.      Social History     Social History Narrative    Lives independently in [place]. Previously worked as a Lawyer. Enjoys gardening, watching golf, singing with her church, art projects Information systems manager). Never smoker, 2 alcoholic drinks a week.       Physical Examination:  Vital Signs: BP 141/64  - Pulse 85  - Temp 37.1 ??C (98.7 ??F) (Oral)  - Resp 12  - Ht 167.6 cm (5' 6)  - Wt 65 kg (143 lb 3.2 oz)  - SpO2 99%  - BMI 23.11 kg/m??     GENERAL: Well appearing female in no acute distress.  HEENT: Sclerae anicteric. OP exam deferred given COVID precautions; wearing mask.   NECK: Supple. No cervical or supraclavicular adenopathy.   SKIN: on the scalp there is a scattered acneiform rash which is limited to hair-bearing areas.  No rash on the trunk or face.  NEURO/PSYCH: No motor abnormalities noted; gait/coordination normal. No focal deficits. Alert & oriented; appropriate mood and affect.   CHEST/BREASTS: (R) breast at 8:00 with 1 cm residual vague mass versus biopsy associated hematoma; Improved from baseline. No R axillary adenopathy.        Pertinent labs/imaging/pathology reviewed in detail.     Lab Results   Component Value Date    WBC 7.6 12/21/2019    HGB 11.4 (L) 12/21/2019    HCT 34.3 (L) 12/21/2019    PLT 288 12/21/2019       Lab Results   Component Value Date    NA 138 11/13/2019    K 4.0 11/13/2019    CL 105 11/13/2019    CO2 26.0 11/13/2019    BUN 13 11/13/2019  CREATININE 0.70 12/21/2019    GLU 123 11/13/2019    CALCIUM 9.7 11/13/2019       Lab Results   Component Value Date    BILITOT 0.4 12/21/2019    BILIDIR <0.10 12/21/2019    PROT 6.6 12/21/2019    ALBUMIN 3.6 12/21/2019    ALT 33 12/21/2019    AST 21 12/21/2019    ALKPHOS 95 12/21/2019       No results found for: PT, INR, APTT

## 2019-12-21 ENCOUNTER — Ambulatory Visit: Admit: 2019-12-21 | Discharge: 2019-12-21 | Payer: MEDICARE

## 2019-12-21 ENCOUNTER — Other Ambulatory Visit: Admit: 2019-12-21 | Discharge: 2019-12-21 | Payer: MEDICARE

## 2019-12-21 DIAGNOSIS — Z170 Estrogen receptor positive status [ER+]: Principal | ICD-10-CM

## 2019-12-21 DIAGNOSIS — C50911 Malignant neoplasm of unspecified site of right female breast: Principal | ICD-10-CM

## 2019-12-21 DIAGNOSIS — C50919 Malignant neoplasm of unspecified site of unspecified female breast: Principal | ICD-10-CM

## 2019-12-21 LAB — CBC W/ AUTO DIFF
BASOPHILS ABSOLUTE COUNT: 0 10*9/L (ref 0.0–0.1)
BASOPHILS RELATIVE PERCENT: 0.4 %
EOSINOPHILS ABSOLUTE COUNT: 0.1 10*9/L (ref 0.0–0.4)
EOSINOPHILS RELATIVE PERCENT: 1.4 %
HEMATOCRIT: 34.3 % — ABNORMAL LOW (ref 36.0–46.0)
HEMOGLOBIN: 11.4 g/dL — ABNORMAL LOW (ref 12.0–16.0)
LYMPHOCYTES ABSOLUTE COUNT: 1.2 10*9/L — ABNORMAL LOW (ref 1.5–5.0)
LYMPHOCYTES RELATIVE PERCENT: 16.3 %
MEAN CORPUSCULAR HEMOGLOBIN CONC: 33.3 g/dL (ref 31.0–37.0)
MEAN CORPUSCULAR HEMOGLOBIN: 32.4 pg (ref 26.0–34.0)
MEAN CORPUSCULAR VOLUME: 97.2 fL (ref 80.0–100.0)
MONOCYTES ABSOLUTE COUNT: 0.4 10*9/L (ref 0.2–0.8)
NEUTROPHILS ABSOLUTE COUNT: 5.7 10*9/L (ref 2.0–7.5)
NEUTROPHILS RELATIVE PERCENT: 75.5 %
PLATELET COUNT: 288 10*9/L (ref 150–440)
RED BLOOD CELL COUNT: 3.53 10*12/L — ABNORMAL LOW (ref 4.00–5.20)
RED CELL DISTRIBUTION WIDTH: 15.6 % — ABNORMAL HIGH (ref 12.0–15.0)
WBC ADJUSTED: 7.6 10*9/L (ref 4.5–11.0)

## 2019-12-21 LAB — EGFR CKD-EPI AA FEMALE: Glomerular filtration rate/1.73 sq M.predicted.black:ArVRat:Pt:Ser/Plas/Bld:Qn:Creatinine-based formula (CKD-EPI): >90

## 2019-12-21 LAB — HEPATIC FUNCTION PANEL
ALBUMIN: 3.6 g/dL (ref 3.4–5.0)
ALKALINE PHOSPHATASE: 95 U/L (ref 46–116)
AST (SGOT): 21 U/L (ref <=34)
BILIRUBIN TOTAL: 0.4 mg/dL (ref 0.3–1.2)

## 2019-12-21 LAB — CREATININE
EGFR CKD-EPI AA FEMALE: >90 mL/min/{1.73_m2} (ref >=60)
EGFR CKD-EPI NON-AA FEMALE: >90 mL/min/{1.73_m2} (ref >=60)

## 2019-12-21 LAB — WBC ADJUSTED: Leukocytes:NCnc:Pt:Bld:Qn:: 7.6

## 2019-12-21 LAB — ALBUMIN: Albumin:MCnc:Pt:Ser/Plas:Qn:: 3.6

## 2019-12-21 MED ADMIN — DOCEtaxel (TAXOTERE) 128.3 mg in sodium chloride 0.9% NON-PVC (NS) 0.9 % 250 mL IVPB: 75 mg/m2 | INTRAVENOUS | @ 19:00:00 | Stop: 2019-12-21

## 2019-12-21 MED ADMIN — fosaprepitant (EMEND) 150 mg in sodium chloride (NS) 0.9 % 100 mL IVPB: 150 mg | INTRAVENOUS | @ 17:00:00 | Stop: 2019-12-21

## 2019-12-21 MED ADMIN — CARBOplatin (PARAPLATIN) 621.6 mg IVPB: 621.6 mg | INTRAVENOUS | @ 20:00:00 | Stop: 2019-12-21

## 2019-12-21 MED ADMIN — ondansetron (ZOFRAN) tablet 24 mg: 24 mg | ORAL | @ 17:00:00 | Stop: 2019-12-21

## 2019-12-21 MED ADMIN — pertuzumab (PERJETA) 420 mg in sodium chloride (NS) 0.9 % 250 mL IVPB: 420 mg | INTRAVENOUS | @ 18:00:00 | Stop: 2019-12-21

## 2019-12-21 MED ADMIN — sodium chloride (NS) 0.9 % infusion: 100 mL/h | INTRAVENOUS | @ 17:00:00

## 2019-12-21 MED ADMIN — dexAMETHasone (DECADRON) tablet 8 mg: 8 mg | ORAL | @ 17:00:00 | Stop: 2019-12-21

## 2019-12-21 MED ADMIN — trastuzumab-anns (KANJINTI) 380 mg in sodium chloride (NS) 0.9 % 250 mL IVPB: 6 mg/kg | INTRAVENOUS | @ 19:00:00 | Stop: 2019-12-21

## 2019-12-21 NOTE — Unmapped (Signed)
This patient has been disenrolled from the [facility] specialty pharmacy services due to enrollment in a manufacturer assistance program that sends medicine directly to the patient.    Rollen Sox  [facility] Specialty Pharmacist

## 2019-12-22 NOTE — Unmapped (Signed)
Pt received in NAD. Port accessed previously and positive blood return noted. Treatment completed without difficulty. Port flushed with 500 units of heparin and deacessed. Patient discharged ambulatory in NAD.

## 2020-01-09 ENCOUNTER — Ambulatory Visit: Admit: 2020-01-09 | Discharge: 2020-01-09 | Payer: MEDICARE | Attending: Adult Health | Primary: Adult Health

## 2020-01-09 ENCOUNTER — Ambulatory Visit: Admit: 2020-01-09 | Discharge: 2020-01-09 | Payer: MEDICARE

## 2020-01-09 ENCOUNTER — Other Ambulatory Visit: Admit: 2020-01-09 | Discharge: 2020-01-09 | Payer: MEDICARE

## 2020-01-09 DIAGNOSIS — Z170 Estrogen receptor positive status [ER+]: Principal | ICD-10-CM

## 2020-01-09 DIAGNOSIS — C50911 Malignant neoplasm of unspecified site of right female breast: Principal | ICD-10-CM

## 2020-01-11 ENCOUNTER — Ambulatory Visit: Admit: 2020-01-11 | Discharge: 2020-01-12 | Payer: MEDICARE

## 2020-01-30 ENCOUNTER — Other Ambulatory Visit: Admit: 2020-01-30 | Discharge: 2020-01-31 | Payer: MEDICARE

## 2020-01-30 ENCOUNTER — Ambulatory Visit: Admit: 2020-01-30 | Discharge: 2020-01-31 | Payer: MEDICARE

## 2020-01-30 DIAGNOSIS — C50911 Malignant neoplasm of unspecified site of right female breast: Principal | ICD-10-CM

## 2020-01-30 DIAGNOSIS — Z170 Estrogen receptor positive status [ER+]: Principal | ICD-10-CM

## 2020-02-01 ENCOUNTER — Ambulatory Visit: Admit: 2020-02-01 | Discharge: 2020-02-02 | Payer: MEDICARE

## 2020-02-02 DIAGNOSIS — Z170 Estrogen receptor positive status [ER+]: Principal | ICD-10-CM

## 2020-02-02 DIAGNOSIS — C50911 Malignant neoplasm of unspecified site of right female breast: Principal | ICD-10-CM

## 2020-02-02 MED ORDER — PEGFILGRASTIM-BMEZ 6 MG/0.6 ML SUBCUTANEOUS SYRINGE
Freq: Once | SUBCUTANEOUS | 0 refills | 1.00000 days | Status: CP
Start: 2020-02-02 — End: 2020-02-02

## 2020-02-13 ENCOUNTER — Ambulatory Visit: Admit: 2020-02-13 | Discharge: 2020-02-14 | Disposition: A | Discharge status: Home / Self Care | Payer: MEDICARE

## 2020-02-13 DIAGNOSIS — Z170 Estrogen receptor positive status [ER+]: Principal | ICD-10-CM

## 2020-02-13 DIAGNOSIS — C50911 Malignant neoplasm of unspecified site of right female breast: Principal | ICD-10-CM

## 2020-02-20 ENCOUNTER — Other Ambulatory Visit: Admit: 2020-02-20 | Discharge: 2020-02-21 | Payer: MEDICARE

## 2020-02-20 ENCOUNTER — Ambulatory Visit: Admit: 2020-02-20 | Discharge: 2020-02-21 | Payer: MEDICARE

## 2020-02-20 DIAGNOSIS — Z170 Estrogen receptor positive status [ER+]: Principal | ICD-10-CM

## 2020-02-20 DIAGNOSIS — R197 Diarrhea, unspecified: Principal | ICD-10-CM

## 2020-02-20 DIAGNOSIS — C50911 Malignant neoplasm of unspecified site of right female breast: Principal | ICD-10-CM

## 2020-02-20 DIAGNOSIS — K5900 Constipation, unspecified: Principal | ICD-10-CM

## 2020-02-22 ENCOUNTER — Ambulatory Visit: Admit: 2020-02-22 | Discharge: 2020-02-23 | Payer: MEDICARE

## 2020-02-22 DIAGNOSIS — C50911 Malignant neoplasm of unspecified site of right female breast: Principal | ICD-10-CM

## 2020-02-22 DIAGNOSIS — Z5111 Encounter for antineoplastic chemotherapy: Principal | ICD-10-CM

## 2020-02-22 DIAGNOSIS — Z170 Estrogen receptor positive status [ER+]: Principal | ICD-10-CM

## 2020-02-23 ENCOUNTER — Ambulatory Visit: Admit: 2020-02-23 | Discharge: 2020-02-24 | Payer: MEDICARE | Attending: Surgical Oncology | Primary: Surgical Oncology

## 2020-02-23 DIAGNOSIS — C50411 Malignant neoplasm of upper-outer quadrant of right female breast: Principal | ICD-10-CM

## 2020-02-23 DIAGNOSIS — Z170 Estrogen receptor positive status [ER+]: Principal | ICD-10-CM

## 2020-02-23 DIAGNOSIS — C50011 Malignant neoplasm of nipple and areola, right female breast: Principal | ICD-10-CM

## 2020-02-27 DIAGNOSIS — N6091 Unspecified benign mammary dysplasia of right breast: Principal | ICD-10-CM

## 2020-02-27 DIAGNOSIS — Z170 Estrogen receptor positive status [ER+]: Principal | ICD-10-CM

## 2020-02-27 DIAGNOSIS — Z882 Allergy status to sulfonamides status: Principal | ICD-10-CM

## 2020-02-27 DIAGNOSIS — C50911 Malignant neoplasm of unspecified site of right female breast: Principal | ICD-10-CM

## 2020-02-27 DIAGNOSIS — Z780 Asymptomatic menopausal state: Principal | ICD-10-CM

## 2020-02-27 DIAGNOSIS — Z9289 Personal history of other medical treatment: Principal | ICD-10-CM

## 2020-02-27 DIAGNOSIS — C50411 Malignant neoplasm of upper-outer quadrant of right female breast: Principal | ICD-10-CM

## 2020-02-27 DIAGNOSIS — Z887 Allergy status to serum and vaccine status: Principal | ICD-10-CM

## 2020-02-27 DIAGNOSIS — D241 Benign neoplasm of right breast: Principal | ICD-10-CM

## 2020-02-27 DIAGNOSIS — C50811 Malignant neoplasm of overlapping sites of right female breast: Principal | ICD-10-CM

## 2020-02-27 DIAGNOSIS — N6311 Unspecified lump in the right breast, upper outer quadrant: Principal | ICD-10-CM

## 2020-02-27 DIAGNOSIS — Z602 Problems related to living alone: Principal | ICD-10-CM

## 2020-02-27 DIAGNOSIS — R921 Mammographic calcification found on diagnostic imaging of breast: Principal | ICD-10-CM

## 2020-02-27 DIAGNOSIS — Z9221 Personal history of antineoplastic chemotherapy: Principal | ICD-10-CM

## 2020-03-07 DIAGNOSIS — Z170 Estrogen receptor positive status [ER+]: Principal | ICD-10-CM

## 2020-03-07 DIAGNOSIS — C50911 Malignant neoplasm of unspecified site of right female breast: Principal | ICD-10-CM

## 2020-03-12 ENCOUNTER — Ambulatory Visit: Admit: 2020-03-12 | Discharge: 2020-03-13 | Payer: MEDICARE

## 2020-03-12 ENCOUNTER — Other Ambulatory Visit: Admit: 2020-03-12 | Discharge: 2020-03-13 | Payer: MEDICARE

## 2020-03-12 DIAGNOSIS — C50911 Malignant neoplasm of unspecified site of right female breast: Principal | ICD-10-CM

## 2020-03-12 DIAGNOSIS — Z170 Estrogen receptor positive status [ER+]: Principal | ICD-10-CM

## 2020-03-12 DIAGNOSIS — Z5112 Encounter for antineoplastic immunotherapy: Principal | ICD-10-CM

## 2020-03-21 ENCOUNTER — Ambulatory Visit
Admit: 2020-03-21 | Discharge: 2020-03-22 | Payer: MEDICARE | Attending: Physician Assistant | Primary: Physician Assistant

## 2020-03-21 DIAGNOSIS — Z01812 Encounter for preprocedural laboratory examination: Principal | ICD-10-CM

## 2020-03-21 DIAGNOSIS — Z20822 Contact with and (suspected) exposure to covid-19: Principal | ICD-10-CM

## 2020-03-22 ENCOUNTER — Ambulatory Visit: Admit: 2020-03-22 | Discharge: 2020-03-23 | Payer: MEDICARE

## 2020-03-22 DIAGNOSIS — C50411 Malignant neoplasm of upper-outer quadrant of right female breast: Principal | ICD-10-CM

## 2020-03-22 DIAGNOSIS — Z170 Estrogen receptor positive status [ER+]: Principal | ICD-10-CM

## 2020-03-22 DIAGNOSIS — Z780 Asymptomatic menopausal state: Principal | ICD-10-CM

## 2020-03-22 DIAGNOSIS — D241 Benign neoplasm of right breast: Principal | ICD-10-CM

## 2020-03-22 DIAGNOSIS — Z887 Allergy status to serum and vaccine status: Principal | ICD-10-CM

## 2020-03-22 DIAGNOSIS — C50811 Malignant neoplasm of overlapping sites of right female breast: Principal | ICD-10-CM

## 2020-03-22 DIAGNOSIS — Z882 Allergy status to sulfonamides status: Principal | ICD-10-CM

## 2020-03-22 DIAGNOSIS — Z602 Problems related to living alone: Principal | ICD-10-CM

## 2020-03-22 DIAGNOSIS — R921 Mammographic calcification found on diagnostic imaging of breast: Principal | ICD-10-CM

## 2020-03-22 DIAGNOSIS — N6091 Unspecified benign mammary dysplasia of right breast: Principal | ICD-10-CM

## 2020-03-22 DIAGNOSIS — N6311 Unspecified lump in the right breast, upper outer quadrant: Principal | ICD-10-CM

## 2020-03-22 DIAGNOSIS — Z9289 Personal history of other medical treatment: Principal | ICD-10-CM

## 2020-03-22 DIAGNOSIS — Z9221 Personal history of antineoplastic chemotherapy: Principal | ICD-10-CM

## 2020-03-23 ENCOUNTER — Encounter
Admit: 2020-03-23 | Discharge: 2020-03-23 | Payer: MEDICARE | Attending: Certified Registered" | Primary: Certified Registered"

## 2020-03-23 ENCOUNTER — Ambulatory Visit: Admit: 2020-03-23 | Discharge: 2020-03-23 | Payer: MEDICARE

## 2020-03-23 DIAGNOSIS — Z9289 Personal history of other medical treatment: Principal | ICD-10-CM

## 2020-03-23 DIAGNOSIS — Z887 Allergy status to serum and vaccine status: Principal | ICD-10-CM

## 2020-03-23 DIAGNOSIS — Z602 Problems related to living alone: Principal | ICD-10-CM

## 2020-03-23 DIAGNOSIS — N6311 Unspecified lump in the right breast, upper outer quadrant: Principal | ICD-10-CM

## 2020-03-23 DIAGNOSIS — Z780 Asymptomatic menopausal state: Principal | ICD-10-CM

## 2020-03-23 DIAGNOSIS — Z882 Allergy status to sulfonamides status: Principal | ICD-10-CM

## 2020-03-23 DIAGNOSIS — C50811 Malignant neoplasm of overlapping sites of right female breast: Principal | ICD-10-CM

## 2020-03-23 DIAGNOSIS — N6091 Unspecified benign mammary dysplasia of right breast: Principal | ICD-10-CM

## 2020-03-23 DIAGNOSIS — R921 Mammographic calcification found on diagnostic imaging of breast: Principal | ICD-10-CM

## 2020-03-23 DIAGNOSIS — Z9221 Personal history of antineoplastic chemotherapy: Principal | ICD-10-CM

## 2020-03-23 DIAGNOSIS — C50411 Malignant neoplasm of upper-outer quadrant of right female breast: Principal | ICD-10-CM

## 2020-03-23 DIAGNOSIS — D241 Benign neoplasm of right breast: Principal | ICD-10-CM

## 2020-03-23 DIAGNOSIS — Z170 Estrogen receptor positive status [ER+]: Principal | ICD-10-CM

## 2020-03-23 MED ORDER — OXYCODONE 5 MG TABLET
ORAL_TABLET | ORAL | 0 refills | 2.00000 days | Status: CP | PRN
Start: 2020-03-23 — End: 2020-03-28

## 2020-03-30 ENCOUNTER — Ambulatory Visit: Admit: 2020-03-30 | Discharge: 2020-03-31 | Payer: MEDICARE

## 2020-03-30 DIAGNOSIS — C50911 Malignant neoplasm of unspecified site of right female breast: Principal | ICD-10-CM

## 2020-03-30 DIAGNOSIS — Z170 Estrogen receptor positive status [ER+]: Principal | ICD-10-CM

## 2020-04-02 ENCOUNTER — Ambulatory Visit: Admit: 2020-04-02 | Discharge: 2020-04-02 | Payer: MEDICARE

## 2020-04-02 DIAGNOSIS — Z170 Estrogen receptor positive status [ER+]: Principal | ICD-10-CM

## 2020-04-02 DIAGNOSIS — C50911 Malignant neoplasm of unspecified site of right female breast: Principal | ICD-10-CM

## 2020-04-16 ENCOUNTER — Ambulatory Visit
Admit: 2020-04-16 | Discharge: 2020-05-14 | Payer: MEDICARE | Attending: Radiation Oncology | Primary: Radiation Oncology

## 2020-04-16 ENCOUNTER — Ambulatory Visit: Admit: 2020-04-16 | Discharge: 2020-05-14 | Payer: MEDICARE

## 2020-04-16 ENCOUNTER — Other Ambulatory Visit: Admit: 2020-04-16 | Discharge: 2020-04-17 | Payer: MEDICARE

## 2020-04-16 ENCOUNTER — Ambulatory Visit: Admit: 2020-04-16 | Discharge: 2020-04-17 | Payer: MEDICARE

## 2020-04-16 DIAGNOSIS — Z923 Personal history of irradiation: Principal | ICD-10-CM

## 2020-04-16 DIAGNOSIS — Z170 Estrogen receptor positive status [ER+]: Principal | ICD-10-CM

## 2020-04-16 DIAGNOSIS — C50911 Malignant neoplasm of unspecified site of right female breast: Principal | ICD-10-CM

## 2020-04-16 DIAGNOSIS — Z510 Encounter for antineoplastic radiation therapy: Principal | ICD-10-CM

## 2020-04-16 MED ORDER — PROCHLORPERAZINE MALEATE 10 MG TABLET
ORAL_TABLET | Freq: Four times a day (QID) | ORAL | 2 refills | 8.00000 days | Status: CP | PRN
Start: 2020-04-16 — End: ?

## 2020-04-19 DIAGNOSIS — Z170 Estrogen receptor positive status [ER+]: Principal | ICD-10-CM

## 2020-04-19 DIAGNOSIS — Z510 Encounter for antineoplastic radiation therapy: Principal | ICD-10-CM

## 2020-04-19 DIAGNOSIS — Z923 Personal history of irradiation: Principal | ICD-10-CM

## 2020-04-19 DIAGNOSIS — C50911 Malignant neoplasm of unspecified site of right female breast: Principal | ICD-10-CM

## 2020-04-19 DIAGNOSIS — C50011 Malignant neoplasm of nipple and areola, right female breast: Principal | ICD-10-CM

## 2020-04-19 DIAGNOSIS — C50811 Malignant neoplasm of overlapping sites of right female breast: Principal | ICD-10-CM

## 2020-05-04 DIAGNOSIS — C50911 Malignant neoplasm of unspecified site of right female breast: Principal | ICD-10-CM

## 2020-05-04 DIAGNOSIS — Z170 Estrogen receptor positive status [ER+]: Principal | ICD-10-CM

## 2020-05-04 DIAGNOSIS — Z510 Encounter for antineoplastic radiation therapy: Principal | ICD-10-CM

## 2020-05-04 DIAGNOSIS — Z923 Personal history of irradiation: Principal | ICD-10-CM

## 2020-05-07 ENCOUNTER — Ambulatory Visit: Admit: 2020-05-07 | Discharge: 2020-05-08 | Payer: MEDICARE

## 2020-05-07 ENCOUNTER — Other Ambulatory Visit: Admit: 2020-05-07 | Discharge: 2020-05-08 | Payer: MEDICARE

## 2020-05-07 DIAGNOSIS — Z170 Estrogen receptor positive status [ER+]: Principal | ICD-10-CM

## 2020-05-07 DIAGNOSIS — Z9011 Acquired absence of right breast and nipple: Principal | ICD-10-CM

## 2020-05-07 DIAGNOSIS — C50911 Malignant neoplasm of unspecified site of right female breast: Principal | ICD-10-CM

## 2020-05-07 DIAGNOSIS — Z79899 Other long term (current) drug therapy: Principal | ICD-10-CM

## 2020-05-07 MED ORDER — ANASTROZOLE 1 MG TABLET
ORAL_TABLET | Freq: Every day | ORAL | 3 refills | 93 days | Status: CP
Start: 2020-05-07 — End: ?

## 2020-05-08 ENCOUNTER — Ambulatory Visit: Admit: 2020-05-08 | Discharge: 2020-05-09

## 2020-05-09 ENCOUNTER — Ambulatory Visit: Admit: 2020-05-09 | Discharge: 2020-05-10 | Payer: MEDICARE

## 2020-05-10 ENCOUNTER — Ambulatory Visit: Admit: 2020-05-10 | Discharge: 2020-05-11

## 2020-05-11 ENCOUNTER — Ambulatory Visit: Admit: 2020-05-11 | Discharge: 2020-05-12 | Payer: MEDICARE

## 2020-05-11 DIAGNOSIS — Z510 Encounter for antineoplastic radiation therapy: Principal | ICD-10-CM

## 2020-05-11 DIAGNOSIS — Z170 Estrogen receptor positive status [ER+]: Principal | ICD-10-CM

## 2020-05-11 DIAGNOSIS — Z923 Personal history of irradiation: Principal | ICD-10-CM

## 2020-05-11 DIAGNOSIS — C50811 Malignant neoplasm of overlapping sites of right female breast: Principal | ICD-10-CM

## 2020-05-11 DIAGNOSIS — C50911 Malignant neoplasm of unspecified site of right female breast: Principal | ICD-10-CM

## 2020-05-14 ENCOUNTER — Ambulatory Visit: Admit: 2020-05-14 | Discharge: 2020-05-15

## 2020-05-15 ENCOUNTER — Other Ambulatory Visit: Admit: 2020-05-15 | Discharge: 2020-06-11 | Payer: MEDICARE

## 2020-05-15 ENCOUNTER — Ambulatory Visit: Admit: 2020-05-15 | Discharge: 2020-05-16

## 2020-05-15 ENCOUNTER — Ambulatory Visit
Admit: 2020-05-15 | Discharge: 2020-06-11 | Payer: MEDICARE | Attending: Radiation Oncology | Primary: Radiation Oncology

## 2020-05-15 ENCOUNTER — Ambulatory Visit: Admit: 2020-05-15 | Discharge: 2020-06-11 | Payer: MEDICARE

## 2020-05-16 ENCOUNTER — Ambulatory Visit: Admit: 2020-05-16 | Discharge: 2020-05-17

## 2020-05-16 DIAGNOSIS — Z170 Estrogen receptor positive status [ER+]: Principal | ICD-10-CM

## 2020-05-16 DIAGNOSIS — C50811 Malignant neoplasm of overlapping sites of right female breast: Principal | ICD-10-CM

## 2020-05-17 ENCOUNTER — Ambulatory Visit: Admit: 2020-05-17 | Discharge: 2020-05-18 | Payer: MEDICARE

## 2020-05-18 ENCOUNTER — Ambulatory Visit: Admit: 2020-05-18 | Discharge: 2020-05-19

## 2020-05-21 ENCOUNTER — Ambulatory Visit: Admit: 2020-05-21 | Discharge: 2020-05-22 | Payer: MEDICARE

## 2020-05-21 DIAGNOSIS — Z5181 Encounter for therapeutic drug level monitoring: Principal | ICD-10-CM

## 2020-05-21 DIAGNOSIS — C50911 Malignant neoplasm of unspecified site of right female breast: Principal | ICD-10-CM

## 2020-05-21 DIAGNOSIS — Z170 Estrogen receptor positive status [ER+]: Principal | ICD-10-CM

## 2020-05-21 DIAGNOSIS — Z79899 Other long term (current) drug therapy: Principal | ICD-10-CM

## 2020-05-21 MED ORDER — LIDOCAINE-PRILOCAINE 2.5 %-2.5 % TOPICAL CREAM
0 refills | 0 days
Start: 2020-05-21 — End: ?

## 2020-05-22 ENCOUNTER — Ambulatory Visit: Admit: 2020-05-22 | Discharge: 2020-05-23

## 2020-05-23 ENCOUNTER — Ambulatory Visit: Admit: 2020-05-23 | Discharge: 2020-05-24 | Payer: MEDICARE

## 2020-05-23 MED ORDER — LIDOCAINE-PRILOCAINE 2.5 %-2.5 % TOPICAL CREAM
0 refills | 0 days
Start: 2020-05-23 — End: ?

## 2020-05-24 ENCOUNTER — Ambulatory Visit: Admit: 2020-05-24 | Discharge: 2020-05-25

## 2020-05-25 ENCOUNTER — Ambulatory Visit: Admit: 2020-05-25 | Discharge: 2020-05-26

## 2020-05-25 DIAGNOSIS — C50811 Malignant neoplasm of overlapping sites of right female breast: Principal | ICD-10-CM

## 2020-05-25 DIAGNOSIS — Z170 Estrogen receptor positive status [ER+]: Principal | ICD-10-CM

## 2020-05-28 ENCOUNTER — Ambulatory Visit: Admit: 2020-05-28 | Discharge: 2020-05-29

## 2020-05-28 DIAGNOSIS — Z5111 Encounter for antineoplastic chemotherapy: Principal | ICD-10-CM

## 2020-05-28 DIAGNOSIS — C50911 Malignant neoplasm of unspecified site of right female breast: Principal | ICD-10-CM

## 2020-05-28 DIAGNOSIS — Z170 Estrogen receptor positive status [ER+]: Principal | ICD-10-CM

## 2020-05-29 ENCOUNTER — Ambulatory Visit: Admit: 2020-05-29 | Discharge: 2020-05-30

## 2020-05-30 ENCOUNTER — Ambulatory Visit: Admit: 2020-05-30 | Discharge: 2020-05-31 | Payer: MEDICARE

## 2020-05-31 ENCOUNTER — Ambulatory Visit: Admit: 2020-05-31 | Discharge: 2020-06-01

## 2020-06-01 ENCOUNTER — Ambulatory Visit: Admit: 2020-06-01 | Discharge: 2020-06-02

## 2020-06-12 ENCOUNTER — Ambulatory Visit: Admit: 2020-06-12 | Payer: MEDICARE

## 2020-06-18 ENCOUNTER — Other Ambulatory Visit: Admit: 2020-06-18 | Discharge: 2020-06-19 | Payer: MEDICARE

## 2020-06-18 ENCOUNTER — Ambulatory Visit: Admit: 2020-06-18 | Discharge: 2020-06-19 | Payer: MEDICARE

## 2020-06-18 DIAGNOSIS — C50911 Malignant neoplasm of unspecified site of right female breast: Principal | ICD-10-CM

## 2020-06-18 DIAGNOSIS — Z170 Estrogen receptor positive status [ER+]: Principal | ICD-10-CM

## 2020-07-09 ENCOUNTER — Other Ambulatory Visit: Admit: 2020-07-09 | Discharge: 2020-07-10 | Payer: MEDICARE

## 2020-07-09 ENCOUNTER — Ambulatory Visit: Admit: 2020-07-09 | Discharge: 2020-07-10 | Payer: MEDICARE

## 2020-07-09 DIAGNOSIS — C50911 Malignant neoplasm of unspecified site of right female breast: Principal | ICD-10-CM

## 2020-07-09 DIAGNOSIS — Z170 Estrogen receptor positive status [ER+]: Principal | ICD-10-CM

## 2020-07-25 DIAGNOSIS — Z170 Estrogen receptor positive status [ER+]: Principal | ICD-10-CM

## 2020-07-25 DIAGNOSIS — C50811 Malignant neoplasm of overlapping sites of right female breast: Principal | ICD-10-CM

## 2020-07-30 ENCOUNTER — Institutional Professional Consult (permissible substitution): Admit: 2020-07-30 | Discharge: 2020-07-31 | Payer: MEDICARE | Attending: Registered" | Primary: Registered"

## 2020-08-06 ENCOUNTER — Ambulatory Visit: Admit: 2020-08-06 | Discharge: 2020-08-06 | Payer: MEDICARE

## 2020-08-06 ENCOUNTER — Ambulatory Visit: Admit: 2020-08-06 | Discharge: 2020-08-06 | Payer: MEDICARE | Attending: Registered" | Primary: Registered"

## 2020-08-06 ENCOUNTER — Other Ambulatory Visit: Admit: 2020-08-06 | Discharge: 2020-08-06 | Payer: MEDICARE

## 2020-08-06 DIAGNOSIS — C50011 Malignant neoplasm of nipple and areola, right female breast: Principal | ICD-10-CM

## 2020-08-06 DIAGNOSIS — Z170 Estrogen receptor positive status [ER+]: Principal | ICD-10-CM

## 2020-08-06 DIAGNOSIS — C50911 Malignant neoplasm of unspecified site of right female breast: Principal | ICD-10-CM

## 2020-08-13 ENCOUNTER — Other Ambulatory Visit: Admit: 2020-08-13 | Discharge: 2020-08-14 | Payer: MEDICARE

## 2020-08-13 ENCOUNTER — Ambulatory Visit: Admit: 2020-08-13 | Discharge: 2020-08-14 | Payer: MEDICARE

## 2020-08-31 ENCOUNTER — Other Ambulatory Visit: Admit: 2020-08-31 | Discharge: 2020-09-01 | Payer: MEDICARE

## 2020-08-31 ENCOUNTER — Ambulatory Visit: Admit: 2020-08-31 | Discharge: 2020-09-01 | Payer: MEDICARE

## 2020-09-24 ENCOUNTER — Ambulatory Visit: Admit: 2020-09-24 | Discharge: 2020-09-25 | Payer: MEDICARE

## 2020-09-24 ENCOUNTER — Other Ambulatory Visit: Admit: 2020-09-24 | Discharge: 2020-09-25 | Payer: MEDICARE

## 2020-09-24 DIAGNOSIS — C50911 Malignant neoplasm of unspecified site of right female breast: Principal | ICD-10-CM

## 2020-09-24 DIAGNOSIS — Z170 Estrogen receptor positive status [ER+]: Principal | ICD-10-CM

## 2020-09-27 DIAGNOSIS — C50011 Malignant neoplasm of nipple and areola, right female breast: Principal | ICD-10-CM

## 2020-09-27 DIAGNOSIS — Z170 Estrogen receptor positive status [ER+]: Principal | ICD-10-CM

## 2020-09-28 ENCOUNTER — Ambulatory Visit: Admit: 2020-09-28 | Discharge: 2020-09-28 | Payer: MEDICARE

## 2020-09-28 DIAGNOSIS — C50911 Malignant neoplasm of unspecified site of right female breast: Principal | ICD-10-CM

## 2020-09-28 DIAGNOSIS — Z170 Estrogen receptor positive status [ER+]: Principal | ICD-10-CM

## 2020-10-01 ENCOUNTER — Ambulatory Visit: Admit: 2020-10-01 | Discharge: 2020-10-02 | Payer: MEDICARE

## 2020-10-01 DIAGNOSIS — C50911 Malignant neoplasm of unspecified site of right female breast: Principal | ICD-10-CM

## 2020-10-01 DIAGNOSIS — Z170 Estrogen receptor positive status [ER+]: Principal | ICD-10-CM

## 2020-10-09 ENCOUNTER — Encounter: Payer: Medicare PPO | Admitting: Dermatology

## 2020-10-15 ENCOUNTER — Ambulatory Visit: Admit: 2020-10-15 | Discharge: 2020-10-16 | Payer: MEDICARE

## 2020-11-05 ENCOUNTER — Ambulatory Visit: Admit: 2020-11-05 | Discharge: 2020-11-06 | Payer: MEDICARE

## 2020-11-26 ENCOUNTER — Ambulatory Visit: Admit: 2020-11-26 | Discharge: 2020-11-27 | Payer: MEDICARE

## 2020-12-14 ENCOUNTER — Ambulatory Visit
Admit: 2020-12-14 | Discharge: 2020-12-15 | Payer: MEDICARE | Attending: Radiation Oncology | Primary: Radiation Oncology

## 2020-12-17 ENCOUNTER — Ambulatory Visit: Admit: 2020-12-17 | Discharge: 2020-12-18 | Payer: MEDICARE

## 2021-01-07 ENCOUNTER — Ambulatory Visit: Admit: 2021-01-07 | Discharge: 2021-01-07 | Payer: MEDICARE

## 2021-01-07 DIAGNOSIS — C50911 Malignant neoplasm of unspecified site of right female breast: Principal | ICD-10-CM

## 2021-01-07 DIAGNOSIS — Z170 Estrogen receptor positive status [ER+]: Principal | ICD-10-CM

## 2021-01-30 ENCOUNTER — Ambulatory Visit: Admit: 2021-01-30 | Discharge: 2021-01-30 | Payer: MEDICARE

## 2021-01-30 DIAGNOSIS — Z170 Estrogen receptor positive status [ER+]: Principal | ICD-10-CM

## 2021-01-30 DIAGNOSIS — C50911 Malignant neoplasm of unspecified site of right female breast: Principal | ICD-10-CM

## 2021-03-26 ENCOUNTER — Other Ambulatory Visit: Payer: Self-pay

## 2021-03-26 ENCOUNTER — Emergency Department
Admission: EM | Admit: 2021-03-26 | Discharge: 2021-03-26 | Disposition: A | Discharge status: Home / Self Care | Payer: Medicare PPO | Attending: Emergency Medicine | Admitting: Emergency Medicine

## 2021-03-26 ENCOUNTER — Emergency Department: Payer: Medicare PPO

## 2021-03-26 DIAGNOSIS — S0003XA Contusion of scalp, initial encounter: Secondary | ICD-10-CM | POA: Diagnosis not present

## 2021-03-26 DIAGNOSIS — M542 Cervicalgia: Secondary | ICD-10-CM | POA: Diagnosis not present

## 2021-03-26 DIAGNOSIS — S80211A Abrasion, right knee, initial encounter: Secondary | ICD-10-CM | POA: Diagnosis not present

## 2021-03-26 DIAGNOSIS — S0990XA Unspecified injury of head, initial encounter: Secondary | ICD-10-CM | POA: Diagnosis present

## 2021-03-26 DIAGNOSIS — W01198A Fall on same level from slipping, tripping and stumbling with subsequent striking against other object, initial encounter: Secondary | ICD-10-CM | POA: Insufficient documentation

## 2021-03-26 DIAGNOSIS — W19XXXA Unspecified fall, initial encounter: Secondary | ICD-10-CM

## 2021-03-26 DIAGNOSIS — Z853 Personal history of malignant neoplasm of breast: Secondary | ICD-10-CM | POA: Diagnosis not present

## 2021-03-26 HISTORY — DX: Malignant (primary) neoplasm, unspecified: C80.1

## 2021-03-26 MED ORDER — DOUBLE ANTIBIOTIC 500-10000 UNIT/GM EX OINT
TOPICAL_OINTMENT | Freq: Two times a day (BID) | CUTANEOUS | Status: DC
  Filled 2021-03-26: qty 28.4

## 2021-03-26 MED ORDER — BACITRACIN-NEOMYCIN-POLYMYXIN 400-5-5000 EX OINT
TOPICAL_OINTMENT | CUTANEOUS | Status: AC
  Filled 2021-03-26: qty 2

## 2021-03-26 NOTE — ED Triage Notes (Signed)
Pt to ED for fall today after getting tangled up in something while getting out of car. No blood thinner use.  Reports hitting left eye on car door. Hematoma noted to posterior right side of head, abrasion noted to right knee.  No LOC.

## 2021-03-26 NOTE — ED Triage Notes (Signed)
Pt was getting stuff out her car got tangled up and fell she has a hematoma on right side, hematoma on left eye and abrasion to right knee. No LOC No blood thinner. Just finished chemo in September. 158/80, 86HR and 100% on room air

## 2021-03-26 NOTE — Discharge Instructions (Signed)
Please seek medical attention for any high fevers, chest pain, shortness of breath, change in behavior, persistent vomiting, bloody stool or any other new or concerning symptoms.  

## 2021-03-26 NOTE — ED Provider Notes (Signed)
[facility] Emergency Department Provider Note   ____________________________________________   I have reviewed the triage vital signs and the nursing notes.   HISTORY  Chief Complaint Fall   History limited by: Not Limited   HPI Teresa Ponce is a 65 y.o. female who presents to the emergency department today after suffering a fall. The patient states that she got her feet tangled up in some rope. This caused her to fall and strike her head first against the bumper of her car and then the ground. She additionally scraped her right knee. She denies any LOC. She has not had any abnormal behavior or confusion since the event. Is complaining of some pain to the back of her head with a hematoma, left side of neck and right knee. Denies any recent illness.   Records reviewed. Per medical record review patient has a history of depression, recently underwent chemotherapy for breast cancer.   Past Medical History:  Diagnosis Date   Cancer ([place])    Depression    Dysplastic nevus 09/12/2013   left axilla   GERD (gastroesophageal reflux disease)     There are no problems to display for this patient.   Past Surgical History:  Procedure Laterality Date   COLONOSCOPY N/A 09/08/2014   Procedure: COLONOSCOPY;  Surgeon: Lucilla Lame, MD;  Location: [place];  Service: Gastroenterology;  Laterality: N/A;    Prior to Admission medications   Medication Sig Start Date End Date Taking? Authorizing Provider  Calcium-Vitamin D (CALTRATE 600 PLUS-VIT D PO) Take by mouth 2 (two) times daily.    [provider]  Cholecalciferol 50 MCG (2000 UT) TABS Take by mouth.    [provider]  citalopram (CELEXA) 20 MG tablet Take 20 mg by mouth every morning.    [provider]  cyanocobalamin 1000 MCG tablet Take by mouth.    [provider]  famotidine (PEPCID) 40 MG tablet Take by mouth.    [provider]  Melatonin 1 MG  TABS Take by mouth at bedtime.    [provider]  metroNIDAZOLE (METROGEL) 0.75 % gel Apply topically daily. 10/03/19   Ralene Bathe, MD  nitrofurantoin, macrocrystal-monohydrate, (MACROBID) 100 MG capsule Take 100 mg by mouth 2 (two) times daily. 06/03/19   [provider]  Omega-3 1000 MG CAPS Take by mouth.    [provider]  omeprazole (PRILOSEC) 10 MG capsule Take 10 mg by mouth every morning.    [provider]    Allergies Sulfa antibiotics  No family history on file.  Social History Social History   Tobacco Use   Smoking status: Never  Substance Use Topics   Alcohol use: Yes    Comment: socially    Review of Systems Constitutional: No fever/chills Eyes: No visual changes. ENT: No sore throat. Cardiovascular: Denies chest pain. Respiratory: Denies shortness of breath. Gastrointestinal: No abdominal pain.  No nausea, no vomiting.  No diarrhea.   Genitourinary: Negative for dysuria. Musculoskeletal: Positive for right knee pain. Skin: Positive for abrasion to right knee.  Neurological: Negative for headaches, focal weakness or numbness.  ____________________________________________   PHYSICAL EXAM:  VITAL SIGNS: ED Triage Vitals  Enc Vitals Group     BP 03/26/21 1353 137/81     Pulse Rate 03/26/21 1353 89     Resp 03/26/21 1353 18     Temp 03/26/21 1353 98.3 F (36.8 C)     Temp Source 03/26/21 1353 Oral  SpO2 03/26/21 1353 98 %     Weight 03/26/21 1354 142 lb (64.4 kg)     Height 03/26/21 1354 5\' 6"  (1.676 m)     Head Circumference --      Peak Flow --      Pain Score 03/26/21 1358 5   Constitutional: Alert and oriented.  Eyes: Conjunctivae are normal.  ENT      Head: Normocephalic. Hematoma to occiput.       Nose: No congestion/rhinnorhea.      Mouth/Throat: Mucous membranes are moist.      Neck: No stridor. Hematological/Lymphatic/Immunilogical: No cervical lymphadenopathy. Cardiovascular: Normal rate,  regular rhythm.  No murmurs, rubs, or gallops.  Respiratory: Normal respiratory effort without tachypnea nor retractions. Breath sounds are clear and equal bilaterally. No wheezes/rales/rhonchi. Gastrointestinal: Soft and non tender. No rebound. No guarding.  Genitourinary: Deferred Musculoskeletal: Normal range of motion in all extremities. No lower extremity edema. Neurologic:  Normal speech and language. No gross focal neurologic deficits are appreciated.  Skin:  Abrasion to right knee.  Psychiatric: Mood and affect are normal. Speech and behavior are normal. Patient exhibits appropriate insight and judgment.  ____________________________________________    LABS (pertinent positives/negatives)  None  ____________________________________________   EKG  None  ____________________________________________    RADIOLOGY  CT head/cervical spine No acute osseous abnormality. No acute intracranial abnormality  Knee  No acute osseous abnormality ____________________________________________   PROCEDURES  Procedures  ____________________________________________   INITIAL IMPRESSION / ASSESSMENT AND PLAN / ED COURSE  Pertinent labs & imaging results that were available during my care of the patient were reviewed by me and considered in my medical decision making (see chart for details).   Patient presents to the emergency department today because of concern for injuries after a fall. Imaging without any concerning traumatic injury. Did discuss possible concussion with patient. Will plan on discharging home.   ____________________________________________   FINAL CLINICAL IMPRESSION(S) / ED DIAGNOSES  Final diagnoses:  Fall, initial encounter  Hematoma of scalp, initial encounter  Abrasion of right knee, initial encounter     Note: This dictation was prepared with Dragon dictation. Any transcriptional errors that result from this process are unintentional      Nance Pear, MD 03/26/21 1553

## 2021-03-29 ENCOUNTER — Ambulatory Visit: Admit: 2021-03-29 | Discharge: 2021-03-29 | Payer: MEDICARE

## 2021-03-29 DIAGNOSIS — C50911 Malignant neoplasm of unspecified site of right female breast: Principal | ICD-10-CM

## 2021-03-29 DIAGNOSIS — Z170 Estrogen receptor positive status [ER+]: Principal | ICD-10-CM

## 2021-03-29 MED ORDER — ANASTROZOLE 1 MG TABLET
ORAL_TABLET | Freq: Every day | ORAL | 3 refills | 93 days | Status: CP
Start: 2021-03-29 — End: ?

## 2021-06-13 ENCOUNTER — Ambulatory Visit
Admit: 2021-06-13 | Discharge: 2021-06-14 | Payer: MEDICARE | Attending: Radiation Oncology | Primary: Radiation Oncology

## 2021-09-16 ENCOUNTER — Ambulatory Visit: Admit: 2021-09-16 | Discharge: 2021-09-17 | Payer: MEDICARE

## 2021-09-16 DIAGNOSIS — C50911 Malignant neoplasm of unspecified site of right female breast: Principal | ICD-10-CM

## 2021-09-16 DIAGNOSIS — Z170 Estrogen receptor positive status [ER+]: Principal | ICD-10-CM

## 2021-09-16 MED ORDER — ANASTROZOLE 1 MG TABLET
ORAL_TABLET | Freq: Every day | ORAL | 3 refills | 93 days | Status: CP
Start: 2021-09-16 — End: ?

## 2021-10-17 ENCOUNTER — Ambulatory Visit: Payer: Medicare PPO | Admitting: Dermatology

## 2021-11-13 ENCOUNTER — Ambulatory Visit: Payer: Medicare PPO | Admitting: Dermatology

## 2021-11-13 DIAGNOSIS — L719 Rosacea, unspecified: Secondary | ICD-10-CM

## 2021-11-13 MED ORDER — METRONIDAZOLE 0.75 % EX GEL: Freq: Every day | CUTANEOUS | 12 refills | Status: AC

## 2021-11-13 NOTE — Progress Notes (Unsigned)
   Follow-Up Visit   Subjective  Teresa Ponce is a 66 y.o. female who presents for the following: Rosacea (Refill Metro gel for rosacea on her face). Patient report her Rosacea has been under control basically clear for several months now using topically Metrogel daily.  The following portions of the chart were reviewed this encounter and updated as appropriate:   Tobacco  Allergies  Meds  Problems  Med Hx  Surg Hx  Fam Hx     Review of Systems:  No other skin or systemic complaints except as noted in HPI or Assessment and Plan.  Objective  Well appearing patient in no apparent distress; mood and affect are within normal limits.  A focused examination was performed including face. Relevant physical exam findings are noted in the Assessment and Plan.  face Mainly clear of Rosacea    Assessment & Plan  Rosacea face  Erythematotelangiectatic rosacea-clear   Rosacea is a chronic progressive skin condition usually affecting the face of adults, causing redness and/or acne bumps. It is treatable but not curable. It sometimes affects the eyes (ocular rosacea) as well. It may respond to topical and/or systemic medication and can flare with stress, sun exposure, alcohol, exercise and some foods.  Daily application of broad spectrum spf 30+ sunscreen to face is recommended to reduce flares.   Pt wondered if she could discontinue MetroGel.  Advised that rosacea is considered a chronic progressive condition and she would likely at least eventually get a flare of her rosacea if she discontinued her treatment.  Typically we would recommend she continue on a regular basis to control her rosacea.  Nevertheless, if she wanted to try to stop her rosacea and start it again when and if she gets a flare she may do so if she desires.  Related Medications metroNIDAZOLE (METROGEL) 0.75 % gel Apply topically daily.  Return in about 1 year (around 11/14/2022) for Rosacea and TBSE hx of Dysplastic  nevus .  IMarye Round, CMA, am acting as scribe for Sarina Ser, MD .  Documentation: I have reviewed the above documentation for accuracy and completeness, and I agree with the above.  Sarina Ser, MD

## 2021-11-13 NOTE — Patient Instructions (Signed)
Due to recent changes in healthcare laws, you may see results of your pathology and/or laboratory studies on MyChart before the doctors have had a chance to review them. We understand that in some cases there may be results that are confusing or concerning to you. Please understand that not all results are received at the same time and often the doctors may need to interpret multiple results in order to provide you with the best plan of care or course of treatment. Therefore, we ask that you please give us 2 business days to thoroughly review all your results before contacting the office for clarification. Should we see a critical lab result, you will be contacted sooner.   If You Need Anything After Your Visit  If you have any questions or concerns for your doctor, please call our main line at [phone] and press option 4 to reach your doctor's medical assistant. If no one answers, please leave a voicemail as directed and we will return your call as soon as possible. Messages left after 4 pm will be answered the following business day.   You may also send us a message via MyChart. We typically respond to MyChart messages within 1-2 business days.  For prescription refills, please ask your pharmacy to contact our office. Our fax number is [phone].  If you have an urgent issue when the clinic is closed that cannot wait until the next business day, you can page your doctor at the number below.    Please note that while we do our best to be available for urgent issues outside of office hours, we are not available 24/7.   If you have an urgent issue and are unable to reach us, you may choose to seek medical care at your doctor's office, retail clinic, urgent care center, or emergency room.  If you have a medical emergency, please immediately call 911 or go to the emergency department.  Pager Numbers  - Dr. Kowalski: [phone]  - Dr. Moye: [phone]  - Dr. Stewart:  [phone]  In the event of inclement weather, please call our main line at [phone] for an update on the status of any delays or closures.  Dermatology Medication Tips: Please keep the boxes that topical medications come in in order to help keep track of the instructions about where and how to use these. Pharmacies typically print the medication instructions only on the boxes and not directly on the medication tubes.   If your medication is too expensive, please contact our office at [phone] option 4 or send us a message through MyChart.   We are unable to tell what your co-pay for medications will be in advance as this is different depending on your insurance coverage. However, we may be able to find a substitute medication at lower cost or fill out paperwork to get insurance to cover a needed medication.   If a prior authorization is required to get your medication covered by your insurance company, please allow us 1-2 business days to complete this process.  Drug prices often vary depending on where the prescription is filled and some pharmacies may offer cheaper prices.  The website www.goodrx.com contains coupons for medications through different pharmacies. The prices here do not account for what the cost may be with help from insurance (it may be cheaper with your insurance), but the website can give you the price if you did not use any insurance.  - You can print the associated coupon and take it with   your prescription to the pharmacy.  - You may also stop by our office during regular business hours and pick up a GoodRx coupon card.  - If you need your prescription sent electronically to a different pharmacy, notify our office through [place] MyChart or by phone at [phone] option 4.     Si Usted Necesita Algo Despus de Su Visita  Tambin puede enviarnos un mensaje a travs de MyChart. Por lo general respondemos a los mensajes de MyChart en el transcurso de 1 a 2  das hbiles.  Para renovar recetas, por favor pida a su farmacia que se ponga en contacto con nuestra oficina. Nuestro nmero de fax es el [phone].  Si tiene un asunto urgente cuando la clnica est cerrada y que no puede esperar hasta el siguiente da hbil, puede llamar/localizar a su doctor(a) al nmero que aparece a continuacin.   Por favor, tenga en cuenta que aunque hacemos todo lo posible para estar disponibles para asuntos urgentes fuera del horario de oficina, no estamos disponibles las 24 horas del da, los 7 das de la semana.   Si tiene un problema urgente y no puede comunicarse con nosotros, puede optar por buscar atencin mdica  en el consultorio de su doctor(a), en una clnica privada, en un centro de atencin urgente o en una sala de emergencias.  Si tiene una emergencia mdica, por favor llame inmediatamente al 911 o vaya a la sala de emergencias.  Nmeros de bper  - Dr. Kowalski: [phone]  - Dra. Moye: [phone]  - Dra. Stewart: [phone]  En caso de inclemencias del tiempo, por favor llame a nuestra lnea principal al [phone] para una actualizacin sobre el estado de cualquier retraso o cierre.  Consejos para la medicacin en dermatologa: Por favor, guarde las cajas en las que vienen los medicamentos de uso tpico para ayudarle a seguir las instrucciones sobre dnde y cmo usarlos. Las farmacias generalmente imprimen las instrucciones del medicamento slo en las cajas y no directamente en los tubos del medicamento.   Si su medicamento es muy caro, por favor, pngase en contacto con nuestra oficina llamando al [phone] y presione la opcin 4 o envenos un mensaje a travs de MyChart.   No podemos decirle cul ser su copago por los medicamentos por adelantado ya que esto es diferente dependiendo de la cobertura de su seguro. Sin embargo, es posible que podamos encontrar un medicamento sustituto a menor costo o llenar un formulario para que el  seguro cubra el medicamento que se considera necesario.   Si se requiere una autorizacin previa para que su compaa de seguros cubra su medicamento, por favor permtanos de 1 a 2 das hbiles para completar este proceso.  Los precios de los medicamentos varan con frecuencia dependiendo del lugar de dnde se surte la receta y alguna farmacias pueden ofrecer precios ms baratos.  El sitio web www.goodrx.com tiene cupones para medicamentos de diferentes farmacias. Los precios aqu no tienen en cuenta lo que podra costar con la ayuda del seguro (puede ser ms barato con su seguro), pero el sitio web puede darle el precio si no utiliz ningn seguro.  - Puede imprimir el cupn correspondiente y llevarlo con su receta a la farmacia.  - Tambin puede pasar por nuestra oficina durante el horario de atencin regular y recoger una tarjeta de cupones de GoodRx.  - Si necesita que su receta se enve electrnicamente a una farmacia diferente, informe a nuestra oficina a travs de MyChart de [place]   o por telfono llamando al [phone] y presione la opcin 4.  

## 2021-11-14 ENCOUNTER — Encounter: Payer: Self-pay | Admitting: Dermatology

## 2021-12-17 ENCOUNTER — Ambulatory Visit: Admit: 2021-12-17 | Discharge: 2021-12-18 | Payer: MEDICARE

## 2021-12-17 DIAGNOSIS — C50911 Malignant neoplasm of unspecified site of right female breast: Principal | ICD-10-CM

## 2021-12-17 DIAGNOSIS — Z170 Estrogen receptor positive status [ER+]: Principal | ICD-10-CM

## 2021-12-17 DIAGNOSIS — C50011 Malignant neoplasm of nipple and areola, right female breast: Principal | ICD-10-CM

## 2022-03-17 ENCOUNTER — Ambulatory Visit: Admit: 2022-03-17 | Discharge: 2022-03-18 | Payer: MEDICARE

## 2022-03-17 DIAGNOSIS — C50911 Malignant neoplasm of unspecified site of right female breast: Principal | ICD-10-CM

## 2022-03-17 DIAGNOSIS — Z170 Estrogen receptor positive status [ER+]: Principal | ICD-10-CM

## 2022-03-17 MED ORDER — ANASTROZOLE 1 MG TABLET
ORAL_TABLET | Freq: Every day | ORAL | 3 refills | 93 days | Status: CP
Start: 2022-03-17 — End: ?

## 2022-08-01 IMAGING — CR DG KNEE COMPLETE 4+V*R*
1 series · 4 of 4 positions shown · non-contrast
Comparison: None

CLINICAL DATA: Pain and abrasions

EXAM:
RIGHT KNEE - COMPLETE 4+ VIEW

[Series 1: dg knee complete 4 views right · 0.14mm/px · 4 of 4 slices shown]
[im 1/4]
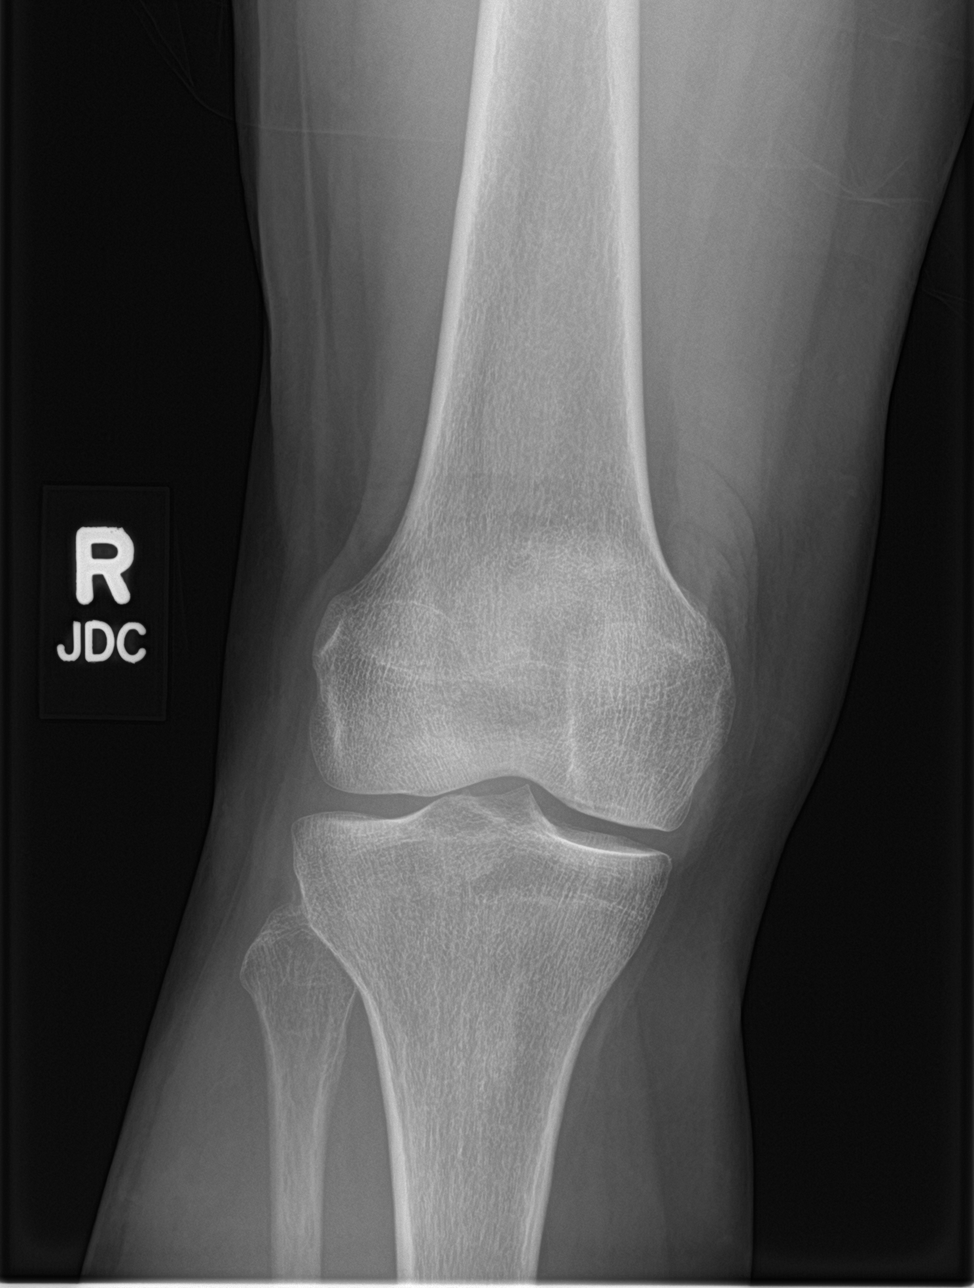
[im 2/4]
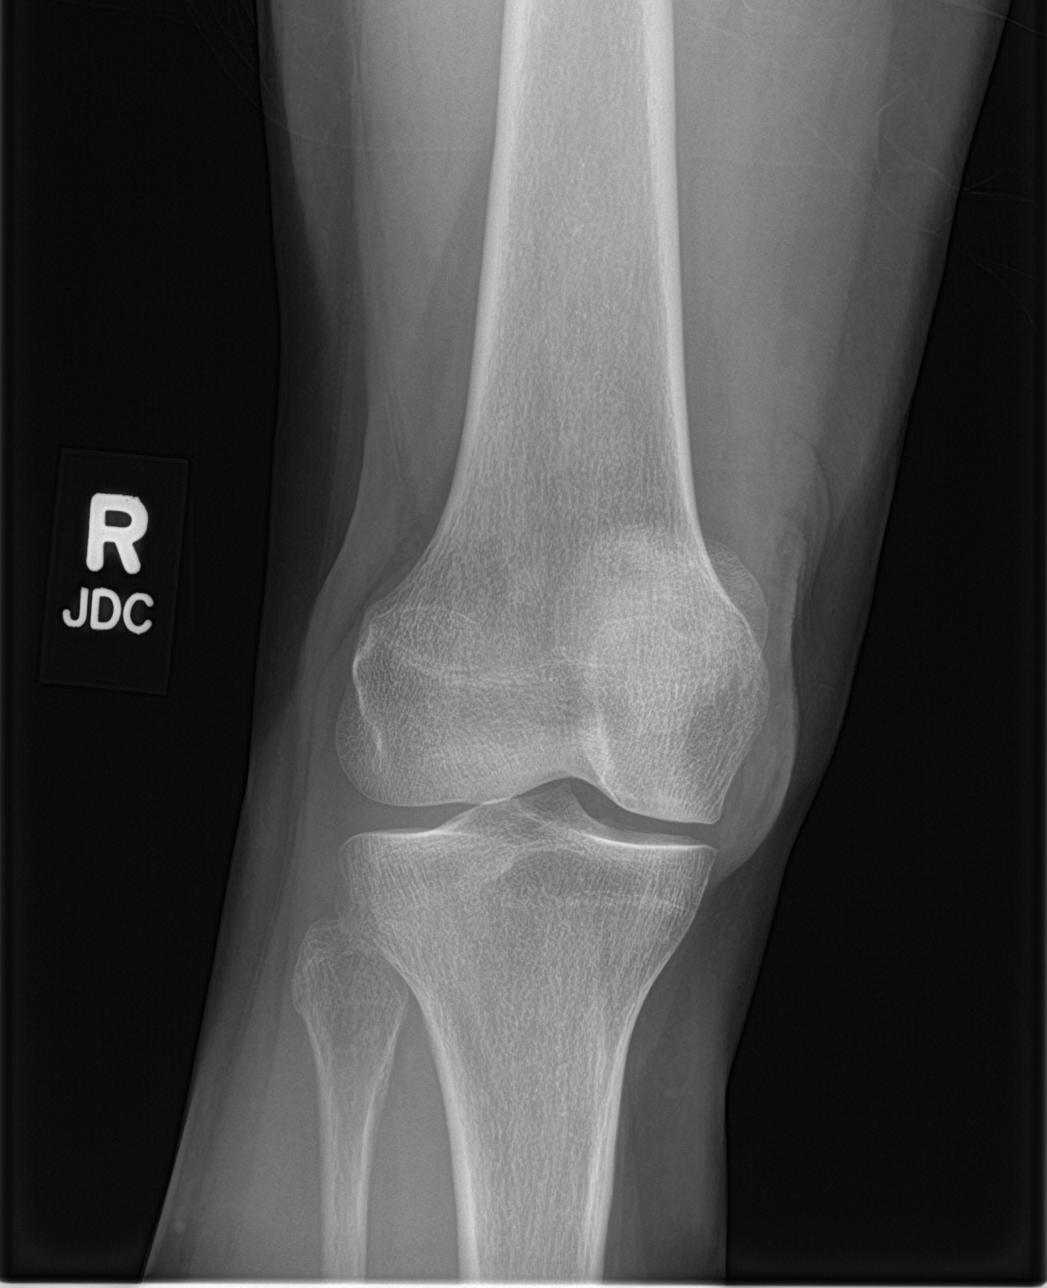
[im 3/4]
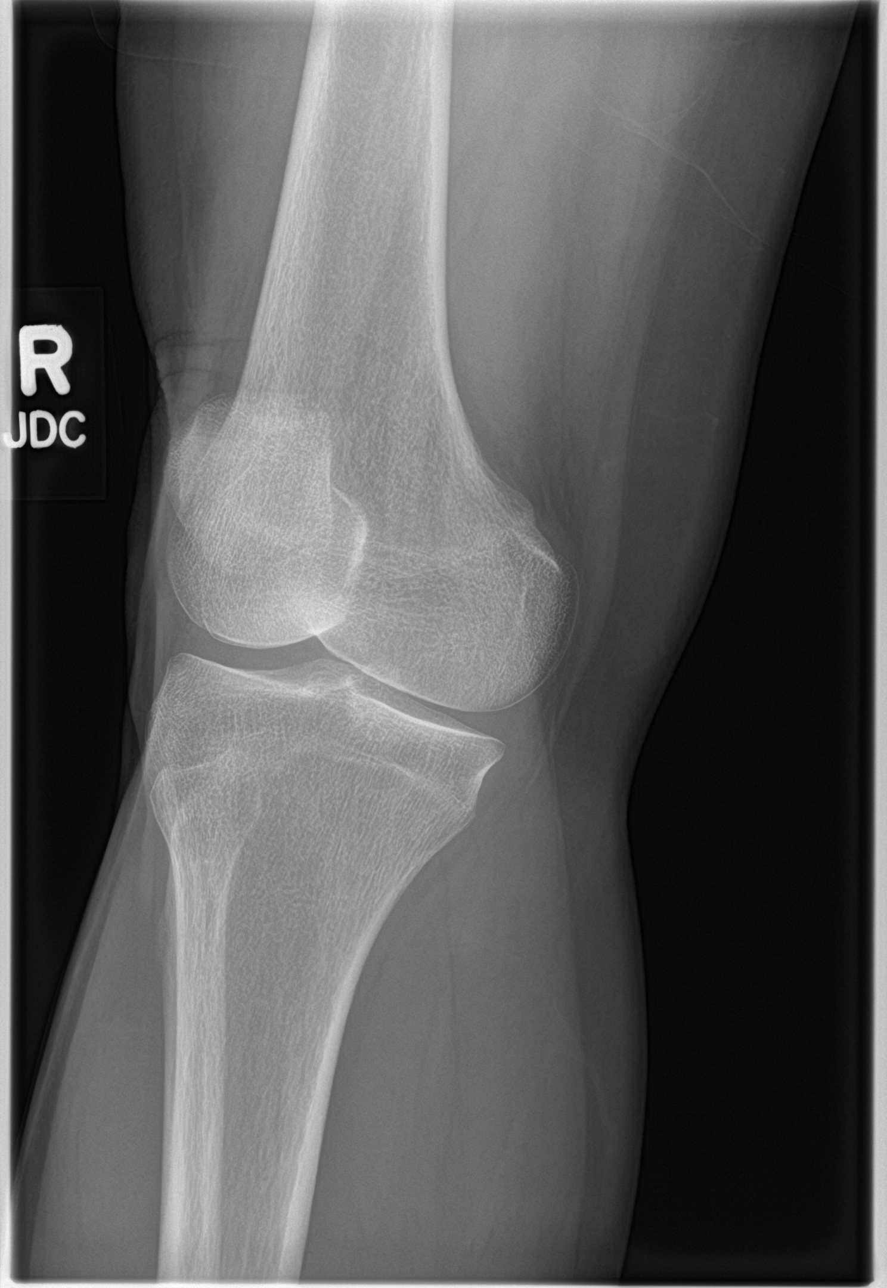
[im 4/4]
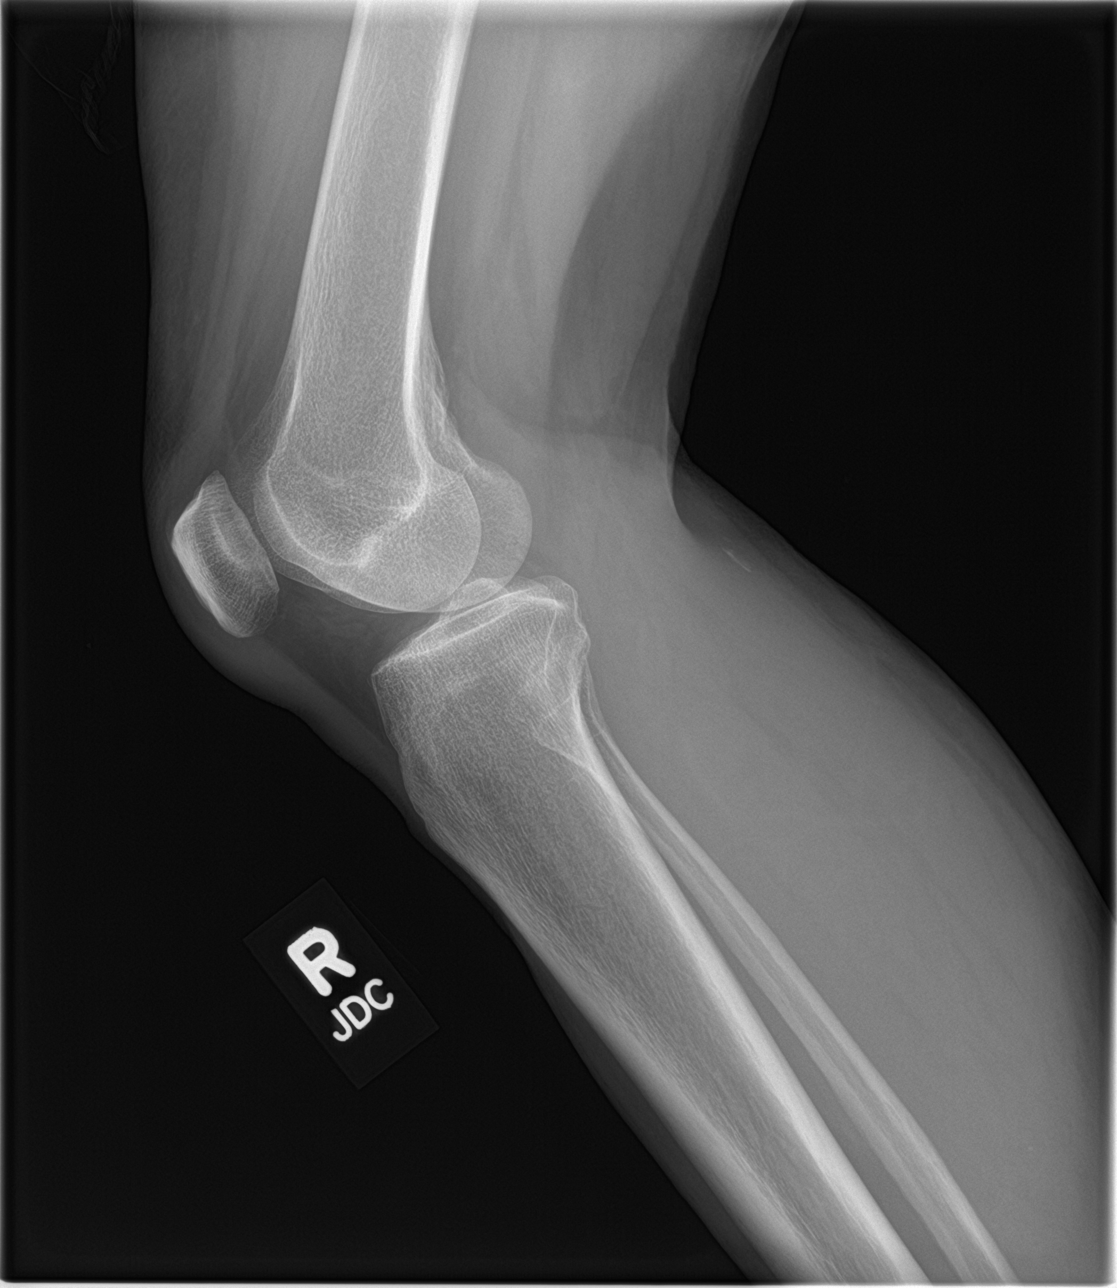

[4 of 4 positions shown; findings below may reference images not displayed]

FINDINGS: No evidence of fracture, dislocation, or joint effusion. No evidence
of arthropathy or other focal bone abnormality. Soft tissues are
unremarkable.
IMPRESSION: Negative.

## 2022-10-01 ENCOUNTER — Ambulatory Visit: Admit: 2022-10-01 | Discharge: 2022-10-01 | Payer: MEDICARE

## 2022-10-01 DIAGNOSIS — C50911 Malignant neoplasm of unspecified site of right female breast: Principal | ICD-10-CM

## 2022-10-01 DIAGNOSIS — Z170 Estrogen receptor positive status [ER+]: Principal | ICD-10-CM

## 2022-10-01 DIAGNOSIS — Z1231 Encounter for screening mammogram for malignant neoplasm of breast: Principal | ICD-10-CM

## 2022-10-01 DIAGNOSIS — C50011 Malignant neoplasm of nipple and areola, right female breast: Principal | ICD-10-CM

## 2022-10-01 MED ORDER — ANASTROZOLE 1 MG TABLET
ORAL_TABLET | Freq: Every day | ORAL | 3 refills | 93 days | Status: CP
Start: 2022-10-01 — End: ?

## 2022-10-29 ENCOUNTER — Ambulatory Visit: Payer: Medicare PPO | Admitting: Dermatology

## 2022-11-12 ENCOUNTER — Encounter: Payer: Self-pay | Admitting: Urology

## 2022-11-12 ENCOUNTER — Ambulatory Visit: Payer: Medicare PPO | Admitting: Urology

## 2022-11-12 VITALS — BP 163/81 | HR 71 | Ht 66.0 in | Wt 147.0 lb

## 2022-11-12 DIAGNOSIS — R3915 Urgency of urination: Secondary | ICD-10-CM | POA: Diagnosis not present

## 2022-11-12 DIAGNOSIS — R300 Dysuria: Secondary | ICD-10-CM | POA: Diagnosis not present

## 2022-11-12 DIAGNOSIS — Z87440 Personal history of urinary (tract) infections: Secondary | ICD-10-CM

## 2022-11-12 DIAGNOSIS — N390 Urinary tract infection, site not specified: Secondary | ICD-10-CM

## 2022-11-12 LAB — MICROSCOPIC EXAMINATION: WBC, UA: >30 /hpf — AB (ref 0–5)

## 2022-11-12 LAB — URINALYSIS, COMPLETE
Bilirubin, UA: NEGATIVE
Glucose, UA: NEGATIVE
Ketones, UA: NEGATIVE
Nitrite, UA: POSITIVE — AB
Protein,UA: NEGATIVE
Specific Gravity, UA: 1.02 (ref 1.005–1.030)
Urobilinogen, Ur: 0.2 mg/dL (ref 0.2–1.0)
pH, UA: 6.5 (ref 5.0–7.5)

## 2022-11-12 LAB — BLADDER SCAN AMB NON-IMAGING: PVR: 0 WU

## 2022-11-12 MED ORDER — CIPROFLOXACIN HCL 250 MG PO TABS: 250.0000 mg | ORAL_TABLET | Freq: Two times a day (BID) | ORAL | 0 refills | Status: AC

## 2022-11-12 NOTE — Progress Notes (Signed)
I,Teresa Ponce,acting as a scribe for Teresa Altes, MD.,have documented all relevant documentation on the behalf of Teresa Altes, MD,as directed by  Teresa Altes, MD while in the presence of Teresa Altes, MD.  11/12/2022 2:49 PM   Teresa Ponce 1955/12/18 [id number]  Referring provider: Marisue Ivan, MD [phone] [facility], [facility] [address] [facility] [place],  [place] [address]  Chief Complaint  Patient presents with   Urinary Tract Infection    HPI: Teresa Ponce is a 67 y.o. female presents for evaluation of recurrent UTI.  Approximately 1 year history of recurrent lower tract UTIs with symptoms primarily frequency, urgency, and dysuria. She has had positive urine cultures since November 2021 for pseudomonas, staff epi, E.coli, and last culture documented infection E.coli in February 2024. Seen early November for recurrent UTI symptoms and urinalysis showed pyuria though urine culture was negative. She was seen by gynecology and started on Vesicare for possible overactive bladder, but was also found to have bacterial vaginosis and treated with Metronidazole.  She has completed the Metronidazole, however, has continued symptoms of dysuria and urgency. No febrile UTIs. Denies gross hematuria.   PMH: Past Medical History:  Diagnosis Date   Cancer (HCC)    Depression    Dysplastic nevus 09/12/2013   left axilla   GERD (gastroesophageal reflux disease)     Surgical History: Past Surgical History:  Procedure Laterality Date   COLONOSCOPY N/A 09/08/2014   Procedure: COLONOSCOPY;  Surgeon: Midge Minium, MD;  Location[facility];  Service: Gastroenterology;  Laterality: N/A;    Home Medications:  Allergies as of 11/12/2022       Reactions   Sulfa Antibiotics Hives        Medication List        Accurate as of November 12, 2022  2:49 PM. If you have any questions, ask your nurse or doctor.          STOP taking these medications     nitrofurantoin (macrocrystal-monohydrate) 100 MG capsule Commonly known as: MACROBID Stopped by: Teresa Ponce       TAKE these medications    CALTRATE 600 PLUS-VIT D PO Take by mouth 2 (two) times daily.   Cholecalciferol 50 MCG (2000 UT) Tabs Take by mouth.   ciprofloxacin 250 MG tablet Commonly known as: Cipro Take 1 tablet (250 mg total) by mouth 2 (two) times daily for 5 days. Started by: Teresa Ponce   citalopram 20 MG tablet Commonly known as: CELEXA Take 20 mg by mouth every morning.   cyanocobalamin 1000 MCG tablet Take by mouth.   famotidine 40 MG tablet Commonly known as: PEPCID Take by mouth.   melatonin 1 MG Tabs tablet Take by mouth at bedtime.   metroNIDAZOLE 0.75 % gel Commonly known as: METROGEL Apply topically daily.   Omega-3 1000 MG Caps Take by mouth.   omeprazole 10 MG capsule Commonly known as: PRILOSEC Take 10 mg by mouth every morning.        Allergies:  Allergies  Allergen Reactions   Sulfa Antibiotics Hives     Social History:  reports that she has never smoked. She does not have any smokeless tobacco history on file. She reports current alcohol use. No history on file for drug use.   Physical Exam: BP (!) 163/81   Pulse 71   Ht 5\' 6"  (1.676 m)   Wt 147 lb (66.7 kg)   BMI 23.73 kg/m   Constitutional:  Alert and oriented, No acute distress. HEENT:  AT, moist mucus membranes.  Trachea midline, no masses. Cardiovascular: No clubbing, cyanosis, or edema. Respiratory: Normal respiratory effort, no increased work of breathing. GI: Abdomen is soft, nontender, nondistended, no abdominal masses Skin: No rashes, bruises or suspicious lesions. Neurologic: Grossly intact, no focal deficits, moving all 4 extremities. Psychiatric: Normal mood and affect.   Laboratory Data:   Urinalysis Dipstick trace blood/3+leukocytes/nitrite positive Microscopy >30 WBC/3-10 RBC/many bacteria.   Assessment & Plan:    1.  Recurrent UTI UA today consistent with UTI; urine culture ordered. Start Cefuroxine 250 mL twice daily x7 days, pending culture report results. Will place on a 3 month trial of low dose antibiotic prophylaxis and will await culture report. Discussed supplements, which may prevent UTI, including D-mannose and cranberry. Discuss low dose vaginal estrogen-estrus cream. A pea sized amount applied to the anterior vaginal wall 2x weekly. She does have a history of breast cancer, and we discussed this small dose is not enough for systemic absorption, but would not start until okayed by her medical oncologist. PVR today      [facility] [address] [place], [place] [address] [phone]

## 2022-11-17 ENCOUNTER — Encounter: Payer: Self-pay | Admitting: Dermatology

## 2022-11-17 ENCOUNTER — Ambulatory Visit: Payer: Medicare PPO | Admitting: Dermatology

## 2022-11-17 VITALS — BP 128/70 | HR 63

## 2022-11-17 DIAGNOSIS — L821 Other seborrheic keratosis: Secondary | ICD-10-CM

## 2022-11-17 DIAGNOSIS — L814 Other melanin hyperpigmentation: Secondary | ICD-10-CM | POA: Diagnosis not present

## 2022-11-17 DIAGNOSIS — Z1283 Encounter for screening for malignant neoplasm of skin: Secondary | ICD-10-CM

## 2022-11-17 DIAGNOSIS — D229 Melanocytic nevi, unspecified: Secondary | ICD-10-CM

## 2022-11-17 DIAGNOSIS — L719 Rosacea, unspecified: Secondary | ICD-10-CM

## 2022-11-17 DIAGNOSIS — L820 Inflamed seborrheic keratosis: Secondary | ICD-10-CM

## 2022-11-17 DIAGNOSIS — D1801 Hemangioma of skin and subcutaneous tissue: Secondary | ICD-10-CM

## 2022-11-17 DIAGNOSIS — L578 Other skin changes due to chronic exposure to nonionizing radiation: Secondary | ICD-10-CM | POA: Diagnosis not present

## 2022-11-17 DIAGNOSIS — Z86018 Personal history of other benign neoplasm: Secondary | ICD-10-CM

## 2022-11-17 DIAGNOSIS — W908XXA Exposure to other nonionizing radiation, initial encounter: Secondary | ICD-10-CM | POA: Diagnosis not present

## 2022-11-17 DIAGNOSIS — Z853 Personal history of malignant neoplasm of breast: Secondary | ICD-10-CM

## 2022-11-17 NOTE — Patient Instructions (Addendum)
Seborrheic Keratosis  What causes seborrheic keratoses? Seborrheic keratoses are harmless, common skin growths that first appear during adult life.  As time goes by, more growths appear.  Some people may develop a large number of them.  Seborrheic keratoses appear on both covered and uncovered body parts.  They are not caused by sunlight.  The tendency to develop seborrheic keratoses can be inherited.  They vary in color from skin-colored to gray, brown, or even black.  They can be either smooth or have a rough, warty surface.   Seborrheic keratoses are superficial and look as if they were stuck on the skin.  Under the microscope this type of keratosis looks like layers upon layers of skin.  That is why at times the top layer may seem to fall off, but the rest of the growth remains and re-grows.    Treatment Seborrheic keratoses do not need to be treated, but can easily be removed in the office.  Seborrheic keratoses often cause symptoms when they rub on clothing or jewelry.  Lesions can be in the way of shaving.  If they become inflamed, they can cause itching, soreness, or burning.  Removal of a seborrheic keratosis can be accomplished by freezing, burning, or surgery. If any spot bleeds, scabs, or grows rapidly, please return to have it checked, as these can be an indication of a skin cancer.   Melanoma ABCDEs  Melanoma is the most dangerous type of skin cancer, and is the leading cause of death from skin disease.  You are more likely to develop melanoma if you: Have light-colored skin, light-colored eyes, or red or blond hair Spend a lot of time in the sun Tan regularly, either outdoors or in a tanning bed Have had blistering sunburns, especially during childhood Have a close family member who has had a melanoma Have atypical moles or large birthmarks  Early detection of melanoma is key since treatment is typically straightforward and cure rates are extremely high if we catch it early.    The first sign of melanoma is often a change in a mole or a new dark spot.  The ABCDE system is a way of remembering the signs of melanoma.  A for asymmetry:  The two halves do not match. B for border:  The edges of the growth are irregular. C for color:  A mixture of colors are present instead of an even brown color. D for diameter:  Melanomas are usually (but not always) greater than 6mm - the size of a pencil eraser. E for evolution:  The spot keeps changing in size, shape, and color.  Please check your skin once per month between visits. You can use a small mirror in front and a large mirror behind you to keep an eye on the back side or your body.   If you see any new or changing lesions before your next follow-up, please call to schedule a visit.  Please continue daily skin protection including broad spectrum sunscreen SPF 30+ to sun-exposed areas, reapplying every 2 hours as needed when you're outdoors.   Staying in the shade or wearing long sleeves, sun glasses (UVA+UVB protection) and wide brim hats (4-inch brim around the entire circumference of the hat) are also recommended for sun protection.    Due to recent changes in healthcare laws, you may see results of your pathology and/or laboratory studies on MyChart before the doctors have had a chance to review them. We understand that in some cases there may  be results that are confusing or concerning to you. Please understand that not all results are received at the same time and often the doctors may need to interpret multiple results in order to provide you with the best plan of care or course of treatment. Therefore, we ask that you please give [place] 2 business days to thoroughly review all your results before contacting the office for clarification. Should we see a critical lab result, you will be contacted sooner.   If You Need Anything After Your Visit  If you have any questions or concerns for your doctor, please call our main  line at [phone] and press option 4 to reach your doctor's medical assistant. If no one answers, please leave a voicemail as directed and we will return your call as soon as possible. Messages left after 4 pm will be answered the following business day.   You may also send [place] a message via MyChart. We typically respond to MyChart messages within 1-2 business days.  For prescription refills, please ask your pharmacy to contact our office. Our fax number is [phone].  If you have an urgent issue when the clinic is closed that cannot wait until the next business day, you can page your doctor at the number below.    Please note that while we do our best to be available for urgent issues outside of office hours, we are not available 24/7.   If you have an urgent issue and are unable to reach [place], you may choose to seek medical care at your doctor's office, retail clinic, urgent care center, or emergency room.  If you have a medical emergency, please immediately call 911 or go to the emergency department.  Pager Numbers  - Dr. Gwen Pounds: [phone]  - Dr. Roseanne Reno: [phone]  In the event of inclement weather, please call our main line at [phone] for an update on the status of any delays or closures.  Dermatology Medication Tips: Please keep the boxes that topical medications come in in order to help keep track of the instructions about where and how to use these. Pharmacies typically print the medication instructions only on the boxes and not directly on the medication tubes.   If your medication is too expensive, please contact our office at [phone] option 4 or send [place] a message through MyChart.   We are unable to tell what your co-pay for medications will be in advance as this is different depending on your insurance coverage. However, we may be able to find a substitute medication at lower cost or fill out paperwork to get insurance to cover a needed medication.   If a  prior authorization is required to get your medication covered by your insurance company, please allow [place] 1-2 business days to complete this process.  Drug prices often vary depending on where the prescription is filled and some pharmacies may offer cheaper prices.  The website www.goodrx.com contains coupons for medications through different pharmacies. The prices here do not account for what the cost may be with help from insurance (it may be cheaper with your insurance), but the website can give you the price if you did not use any insurance.  - You can print the associated coupon and take it with your prescription to the pharmacy.  - You may also stop by our office during regular business hours and pick up a GoodRx coupon card.  - If you need your prescription sent electronically to a different pharmacy, notify our office  through [facility] or by phone at [phone] option 4.     Si Usted Necesita Algo Despus de Su Visita  Tambin puede enviarnos un mensaje a travs de Clinical cytogeneticist. Por lo general respondemos a los mensajes de MyChart en el transcurso de 1 a 2 das hbiles.  Para renovar recetas, por favor pida a su farmacia que se ponga en contacto con nuestra oficina. Annie Sable de fax es [place] [phone].  Si tiene un asunto urgente cuando la clnica est cerrada y que no puede esperar hasta el siguiente da hbil, puede llamar/localizar a su doctor(a) al nmero que aparece a continuacin.   Por favor, tenga en cuenta que aunque hacemos todo lo posible para estar disponibles para asuntos urgentes fuera del horario de [place], no estamos disponibles las 24 horas del da, los 7 [address]  [address] de la [place].   Si tiene un problema urgente y no puede comunicarse con nosotros, puede optar por buscar atencin mdica  en el consultorio de su doctor(a), en una clnica privada, en un centro de atencin urgente o en una sala de emergencias.  Si tiene Engineer, drilling, por favor llame  inmediatamente al 911 o vaya a la sala de emergencias.  Nmeros de bper  - Dr. Gwen Pounds: [phone]  - Dra. Roseanne Reno: [phone]  En caso de inclemencias del [place], por favor llame a Lacy Duverney principal al [phone] para una actualizacin sobre el [place] de cualquier retraso o cierre.  Consejos para la medicacin en dermatologa: Por favor, guarde las cajas en las que vienen los medicamentos de uso tpico para ayudarle a seguir las instrucciones sobre dnde y cmo usarlos. Las farmacias generalmente imprimen las instrucciones del medicamento slo en las cajas y no directamente en los tubos del [place].   Si su medicamento es muy caro, por favor, pngase en contacto con Rolm [place]ala llamando al [phone] y presione la opcin 4 o envenos un mensaje a travs de Clinical cytogeneticist.   No podemos decirle cul ser su copago por los medicamentos por adelantado ya que esto es diferente dependiendo de la cobertura de su seguro. Sin embargo, es posible que podamos encontrar un medicamento sustituto a Audiological scientist un formulario para que el seguro cubra el medicamento que se considera necesario.   Si se requiere una autorizacin previa para que su compaa de seguros [place] su medicamento, por favor permtanos de 1 a 2 das hbiles para completar [address]  Los precios de los medicamentos varan con frecuencia dependiendo del Environmental consultant de dnde se surte la receta y alguna farmacias pueden ofrecer precios ms baratos.  El sitio web www.goodrx.com tiene cupones para medicamentos de Health and safety inspector. Los precios aqu no tienen en cuenta lo que podra costar con la ayuda del seguro (puede ser ms barato con su seguro), pero el sitio web puede darle el precio si no utiliz Tourist information centre manager.  - Puede imprimir el cupn correspondiente y llevarlo con su receta a la farmacia.  - Tambin puede pasar por nuestra oficina durante el horario de atencin regular y Education officer, museum una tarjeta de cupones de  GoodRx.  - Si necesita que su receta se enve electrnicamente a una farmacia diferente, informe a nuestra oficina a travs de MyChart de  o por telfono llamando al [phone] y presione la opcin 4.

## 2022-11-17 NOTE — Progress Notes (Unsigned)
Follow-Up Visit   Subjective  Teresa Ponce is a 67 y.o. female who presents for the following: Skin Cancer Screening and Full Body Skin Exam Hx of dysplastic , hx of rosacea  The patient presents for Total-Body Skin Exam (TBSE) for skin cancer screening and mole check. The patient has spots, moles and lesions to be evaluated, some may be new or changing and the patient may have concern these could be cancer.  The following portions of the chart were reviewed this encounter and updated as appropriate: medications, allergies, medical history  Review of Systems:  No other skin or systemic complaints except as noted in HPI or Assessment and Plan.  Objective  Well appearing patient in no apparent distress; mood and affect are within normal limits.  A full examination was performed including scalp, head, eyes, ears, nose, lips, neck, chest, axillae, abdomen, back, buttocks, bilateral upper extremities, bilateral lower extremities, hands, feet, fingers, toes, fingernails, and toenails. All findings within normal limits unless otherwise noted below.   Relevant physical exam findings are noted in the Assessment and Plan.  left forehead at hairline x 1 Erythematous stuck-on, waxy papule or plaque   Assessment & Plan   SKIN CANCER SCREENING PERFORMED TODAY.  ACTINIC DAMAGE - Chronic condition, secondary to cumulative UV/sun exposure - diffuse scaly erythematous macules with underlying dyspigmentation - Recommend daily broad spectrum sunscreen SPF 30+ to sun-exposed areas, reapply every 2 hours as needed.  - Staying in the shade or wearing long sleeves, sun glasses (UVA+UVB protection) and wide brim hats (4-inch brim around the entire circumference of the hat) are also recommended for sun protection.  - Call for new or changing lesions.  LENTIGINES, SEBORRHEIC KERATOSES, HEMANGIOMAS - Benign normal skin lesions - Benign-appearing - Call for any changes  MELANOCYTIC NEVI - Tan-brown  and/or pink-flesh-colored symmetric macules and papules - Benign appearing on exam today - Observation - Call clinic for new or changing moles - Recommend daily use of broad spectrum spf 30+ sunscreen to sun-exposed areas.   HISTORY OF DYSPLASTIC NEVUS At left axilla 2015 No evidence of recurrence today Recommend regular full body skin exams Recommend daily broad spectrum sunscreen SPF 30+ to sun-exposed areas, reapply every 2 hours as needed.  Call if any new or changing lesions are noted between office visits  Rosacea face  Erythematotelangiectatic rosacea-clear  Exam : clear today at exam  Chronic condition with duration or expected duration over one year. Currently well-controlled. Rosacea is a chronic progressive skin condition usually affecting the face of adults, causing redness and/or acne bumps. It is treatable but not curable. It sometimes affects the eyes (ocular rosacea) as well. It may respond to topical and/or systemic medication and can flare with stress, sun exposure, alcohol, exercise and some foods.  Daily application of broad spectrum spf 30+ sunscreen to face is recommended to reduce flares.    Pt wondered if she could discontinue MetroGel.  Advised that rosacea is considered a chronic progressive condition and she would likely at least eventually get a flare of her rosacea if she discontinued her treatment.  Typically we would recommend she continue on a regular basis to control her rosacea.  Nevertheless, if she wanted to try to stop her rosacea and start it again when and if she gets a flare she may do so if she desires.   Using metronidazole 0.75 % gel as needed for rosacea   Patient will call or send mychart if needing refills   Inflamed seborrheic  keratosis left forehead at hairline x 1  Symptomatic, irritating, patient would like treated.  Destruction of lesion - left forehead at hairline x 1 Complexity: simple   Destruction method: cryotherapy   Informed  consent: discussed and consent obtained   Timeout:  patient name, date of birth, surgical site, and procedure verified Lesion destroyed using liquid nitrogen: Yes   Region frozen until ice ball extended beyond lesion: Yes   Outcome: patient tolerated procedure well with no complications   Post-procedure details: wound care instructions given     HISTORY OF breast cancer of right 2021 No lymphanopathy  - Clear. Observe for recurrence.  - Call clinic for new or changing lesions.   - Recommend regular skin exams, daily broad-spectrum spf 30+ sunscreen use, and photoprotection.     Return in about 1 year (around 11/17/2023) for TBSE.  IAsher Muir, CMA, am acting as scribe for Armida Sans, MD.  Documentation: I have reviewed the above documentation for accuracy and completeness, and I agree with the above.  Armida Sans, MD

## 2022-11-18 ENCOUNTER — Other Ambulatory Visit: Payer: Self-pay | Admitting: *Deleted

## 2022-11-18 MED ORDER — CEPHALEXIN 500 MG PO CAPS
500.0000 mg | ORAL_CAPSULE | Freq: Every day | ORAL | 2 refills | Status: AC
Start: 2022-11-18 — End: 2023-02-16

## 2022-11-18 MED ORDER — CEPHALEXIN 500 MG PO CAPS: 500.0000 mg | ORAL_CAPSULE | Freq: Every day | ORAL | 1 refills | Status: DC

## 2022-11-18 NOTE — Addendum Note (Signed)
Addended by: Levada Schilling on: 11/18/2022 09:06 AM   Modules accepted: Orders

## 2022-11-19 ENCOUNTER — Encounter: Payer: Self-pay | Admitting: Dermatology

## 2023-02-19 ENCOUNTER — Encounter: Payer: Self-pay | Admitting: Physician Assistant

## 2023-02-19 ENCOUNTER — Ambulatory Visit: Payer: Medicare PPO | Admitting: Physician Assistant

## 2023-02-19 VITALS — BP 137/79 | HR 69

## 2023-02-19 DIAGNOSIS — Z09 Encounter for follow-up examination after completed treatment for conditions other than malignant neoplasm: Secondary | ICD-10-CM

## 2023-02-19 DIAGNOSIS — N390 Urinary tract infection, site not specified: Secondary | ICD-10-CM

## 2023-02-19 DIAGNOSIS — Z87440 Personal history of urinary (tract) infections: Secondary | ICD-10-CM

## 2023-02-19 LAB — URINALYSIS, COMPLETE
Bilirubin, UA: NEGATIVE
Glucose, UA: NEGATIVE
Ketones, UA: NEGATIVE
Leukocytes,UA: NEGATIVE
Nitrite, UA: NEGATIVE
Protein,UA: NEGATIVE
RBC, UA: NEGATIVE
Specific Gravity, UA: 1.015 (ref 1.005–1.030)
Urobilinogen, Ur: 0.2 mg/dL (ref 0.2–1.0)
pH, UA: 7 (ref 5.0–7.5)

## 2023-02-19 LAB — MICROSCOPIC EXAMINATION: Bacteria, UA: NONE SEEN

## 2023-02-19 NOTE — Progress Notes (Signed)
02/19/2023 9:14 AM   Teresa Ponce 06/09/1955 [id number]  CC: Chief Complaint  Patient presents with   Follow-up   Recurrent UTI   HPI: Teresa Ponce is a 67 y.o. female with PMH breast cancer on anastrozole and recurrent UTI who presents today for follow-up on suppressive Keflex 500 mg daily x 3 months and vaginal estrogen cream.   Today she reports she took her last dose of Keflex this morning and has had no breakthrough infections.  She reports very mild baseline urgency without urge incontinence.  Overall she feels she is doing well, but she does not wish to remain on antibiotics long-term.  In-office UA and microscopy today pan negative.  PMH: Past Medical History:  Diagnosis Date   Cancer (HCC)    Depression    Dysplastic nevus 09/12/2013   left axilla   GERD (gastroesophageal reflux disease)     Surgical History: Past Surgical History:  Procedure Laterality Date   COLONOSCOPY N/A 09/08/2014   Procedure: COLONOSCOPY;  Surgeon: Midge Minium, MD;  Location: [facility];  Service: Gastroenterology;  Laterality: N/A;    Home Medications:  Allergies as of 02/19/2023       Reactions   Sulfa Antibiotics Hives        Medication List        Accurate as of February 19, 2023  9:14 AM. If you have any questions, ask your nurse or doctor.          anastrozole 1 MG tablet Commonly known as: ARIMIDEX Take 1 mg by mouth daily.   CALTRATE 600 PLUS-VIT D PO Take by mouth 2 (two) times daily.   Cholecalciferol 50 MCG (2000 UT) Tabs Take by mouth.   citalopram 20 MG tablet Commonly known as: CELEXA Take 20 mg by mouth every morning.   cyanocobalamin 1000 MCG tablet Take by mouth.   famotidine 40 MG tablet Commonly known as: PEPCID Take by mouth.   ferrous sulfate 325 (65 FE) MG EC tablet Take 1 tablet by mouth daily with breakfast.   melatonin 1 MG Tabs tablet Take by mouth at bedtime.   metroNIDAZOLE 0.75 % gel Commonly known as:  METROGEL Apply topically daily.   Omega-3 1000 MG Caps Take by mouth.   omeprazole 10 MG capsule Commonly known as: PRILOSEC Take 10 mg by mouth every morning.        Allergies:  Allergies  Allergen Reactions   Sulfa Antibiotics Hives    Family History: No family history on file.  Social History:   reports that she has never smoked. She does not have any smokeless tobacco history on file. She reports current alcohol use. No history on file for drug use.  Physical Exam: BP 137/79   Pulse 69   Constitutional:  Alert and oriented, no acute distress, nontoxic appearing HEENT: [place], AT Cardiovascular: No clubbing, cyanosis, or edema Respiratory: Normal respiratory effort, no increased work of breathing Skin: No rashes, bruises or suspicious lesions Neurologic: Grossly intact, no focal deficits, moving all 4 extremities Psychiatric: Normal mood and affect  Laboratory Data: Results for orders placed or performed in visit on 02/19/23  Microscopic Examination   Urine  Result Value Ref Range   WBC, UA 0-5 0 - 5 /hpf   RBC, Urine 0-2 0 - 2 /hpf   Epithelial Cells (non renal) 0-10 0 - 10 /hpf   Bacteria, UA None seen None seen/Few  Urinalysis, Complete  Result Value Ref Range   Specific Gravity, UA  1.015 1.005 - 1.030   pH, UA 7.0 5.0 - 7.5   Color, UA Yellow Yellow   Appearance Ur Clear Clear   Leukocytes,UA Negative Negative   Protein,UA Negative Negative/Trace   Glucose, UA Negative Negative   Ketones, UA Negative Negative   RBC, UA Negative Negative   Bilirubin, UA Negative Negative   Urobilinogen, Ur 0.2 0.2 - 1.0 mg/dL   Nitrite, UA Negative Negative   Microscopic Examination See below:    Assessment & Plan:   1. Recurrent UTI Infection free on suppressive antibiotics x 3 months.  She wishes to discontinue this, which is reasonable.  We discussed UTI prevention strategies including increased p.o. hydration, cranberry supplements, d-mannose, and lactobacillus  containing probiotics.  We also discussed using nonhormonal vaginal moisturizers to help with vulvovaginal irritation, which are common side effects of anastrozole.  Will defer topical vaginal estrogen cream given use of anastrozole, may revisit this in the future once she completes therapy.  May consider Hiprex versus resuming suppressive antibiotics if she continues to have frequent infections in the future. - Urinalysis, Complete  Return if symptoms worsen or fail to improve.  Carman Ching, PA-C  [facility] [address] [place], [place] [address] [phone]

## 2023-02-19 NOTE — Patient Instructions (Signed)
Stay well hydrated. Start taking an over-the-counter cranberry supplement for urinary tract health. Take this once or twice daily on an empty stomach, e.g. right before bed. Consider starting an over-the-counter d-mannose supplement. Take this daily per packaging instructions. Start taking an over-the-counter probiotic containing the bacterial species called Lactobacillus. Take this daily. Try the over-the-counter nonhormonal vaginal moisturizer called Replens to help with any vulvovaginal dryness or irritation.

## 2023-03-18 ENCOUNTER — Ambulatory Visit: Admit: 2023-03-18 | Discharge: 2023-03-18 | Payer: MEDICARE

## 2023-03-19 ENCOUNTER — Ambulatory Visit
Admit: 2023-03-19 | Discharge: 2023-03-20 | Payer: MEDICARE | Attending: Radiation Oncology | Primary: Radiation Oncology

## 2023-07-06 ENCOUNTER — Other Ambulatory Visit: Payer: Self-pay | Admitting: Family Medicine

## 2023-07-06 DIAGNOSIS — Z9189 Other specified personal risk factors, not elsewhere classified: Secondary | ICD-10-CM

## 2023-07-06 DIAGNOSIS — N289 Disorder of kidney and ureter, unspecified: Secondary | ICD-10-CM

## 2023-07-15 ENCOUNTER — Ambulatory Visit
Admission: RE | Admit: 2023-07-15 | Discharge: 2023-07-15 | Disposition: A | Discharge status: Home / Self Care | Payer: Self-pay | Source: Ambulatory Visit | Attending: Family Medicine | Admitting: Family Medicine

## 2023-07-15 DIAGNOSIS — N289 Disorder of kidney and ureter, unspecified: Secondary | ICD-10-CM | POA: Insufficient documentation

## 2023-07-15 DIAGNOSIS — Z9189 Other specified personal risk factors, not elsewhere classified: Secondary | ICD-10-CM | POA: Insufficient documentation

## 2023-10-01 ENCOUNTER — Inpatient Hospital Stay: Admit: 2023-10-01 | Discharge: 2023-10-02 | Payer: Medicare (Managed Care)

## 2023-10-01 ENCOUNTER — Ambulatory Visit: Admit: 2023-10-01 | Discharge: 2023-10-02 | Payer: Medicare (Managed Care)

## 2023-10-01 DIAGNOSIS — C50911 Malignant neoplasm of unspecified site of right female breast: Principal | ICD-10-CM

## 2023-10-01 DIAGNOSIS — Z1231 Encounter for screening mammogram for malignant neoplasm of breast: Principal | ICD-10-CM

## 2023-10-01 DIAGNOSIS — Z170 Estrogen receptor positive status [ER+]: Principal | ICD-10-CM

## 2023-12-03 ENCOUNTER — Ambulatory Visit: Payer: Medicare PPO | Admitting: Dermatology

## 2023-12-03 ENCOUNTER — Encounter: Payer: Self-pay | Admitting: Dermatology

## 2023-12-03 DIAGNOSIS — L821 Other seborrheic keratosis: Secondary | ICD-10-CM | POA: Diagnosis not present

## 2023-12-03 DIAGNOSIS — Z1283 Encounter for screening for malignant neoplasm of skin: Secondary | ICD-10-CM | POA: Diagnosis not present

## 2023-12-03 DIAGNOSIS — Z7189 Other specified counseling: Secondary | ICD-10-CM

## 2023-12-03 DIAGNOSIS — D229 Melanocytic nevi, unspecified: Secondary | ICD-10-CM

## 2023-12-03 DIAGNOSIS — T1490XA Injury, unspecified, initial encounter: Secondary | ICD-10-CM

## 2023-12-03 DIAGNOSIS — S99921A Unspecified injury of right foot, initial encounter: Secondary | ICD-10-CM

## 2023-12-03 DIAGNOSIS — L578 Other skin changes due to chronic exposure to nonionizing radiation: Secondary | ICD-10-CM | POA: Diagnosis not present

## 2023-12-03 DIAGNOSIS — W908XXA Exposure to other nonionizing radiation, initial encounter: Secondary | ICD-10-CM

## 2023-12-03 DIAGNOSIS — Z86018 Personal history of other benign neoplasm: Secondary | ICD-10-CM

## 2023-12-03 DIAGNOSIS — L814 Other melanin hyperpigmentation: Secondary | ICD-10-CM

## 2023-12-03 NOTE — Progress Notes (Signed)
 Follow-Up Visit   Subjective  Teresa Ponce is a 68 y.o. female who presents for the following: Skin Cancer Screening and Full Body Skin Exam, hx of Dysplastic nevus. Rosacea not currently treating, treated with Metronidazole  cream in the past.   The patient presents for Total-Body Skin Exam (TBSE) for skin cancer screening and mole check. The patient has spots, moles and lesions to be evaluated, some may be new or changing and the patient may have concern these could be cancer.  The following portions of the chart were reviewed this encounter and updated as appropriate: medications, allergies, medical history  Review of Systems:  No other skin or systemic complaints except as noted in HPI or Assessment and Plan.  Objective  Well appearing patient in no apparent distress; mood and affect are within normal limits.  A full examination was performed including scalp, head, eyes, ears, nose, lips, neck, chest, axillae, abdomen, back, buttocks, bilateral upper extremities, bilateral lower extremities, hands, feet, fingers, toes, fingernails, and toenails. All findings within normal limits unless otherwise noted below.   Relevant physical exam findings are noted in the Assessment and Plan.   Assessment & Plan   SKIN CANCER SCREENING PERFORMED TODAY.  ACTINIC DAMAGE - Chronic condition, secondary to cumulative UV/sun exposure - diffuse scaly erythematous macules with underlying dyspigmentation - Recommend daily broad spectrum sunscreen SPF 30+ to sun-exposed areas, reapply every 2 hours as needed.  - Staying in the shade or wearing long sleeves, sun glasses (UVA+UVB protection) and wide brim hats (4-inch brim around the entire circumference of the hat) are also recommended for sun protection.  - Call for new or changing lesions.  LENTIGINES, SEBORRHEIC KERATOSES, HEMANGIOMAS - Benign normal skin lesions - Benign-appearing - Call for any changes  MELANOCYTIC NEVI - Tan-brown and/or  pink-flesh-colored symmetric macules and papules - Benign appearing on exam today - Observation - Call clinic for new or changing moles - Recommend daily use of broad spectrum spf 30+ sunscreen to sun-exposed areas.   Rosacea face  Erythematotelangiectatic rosacea-clear  Exam : clear today at exam  Chronic condition with duration or expected duration over one year. Currently well-controlled. Rosacea is a chronic progressive skin condition usually affecting the face of adults, causing redness and/or acne bumps. It is treatable but not curable. It sometimes affects the eyes (ocular rosacea) as well. It may respond to topical and/or systemic medication and can flare with stress, sun exposure, alcohol, exercise and some foods.  Daily application of broad spectrum spf 30+ sunscreen to face is recommended to reduce flares.    Pt wondered if she could discontinue MetroGel .  Advised that rosacea is considered a chronic progressive condition and she would likely at least eventually get a flare of her rosacea if she discontinued her treatment.  Typically we would recommend she continue on a regular basis to control her rosacea.  Nevertheless, if she wanted to try to stop her rosacea and start it again when and if she gets a flare she may do so if she desires.   Using metronidazole  0.75 % gel as needed for rosacea    Patient will call or send mychart if needing refills    TRAUMA  Right 5th toenail Subungual blood  Benign-appearing.  Observation.  Call clinic for new or changing lesions.  Recommend daily use of broad spectrum spf 30+ sunscreen to sun-exposed areas.  Counseled and reassured OK. Observe    HISTORY OF DYSPLASTIC NEVUS Left axilla 2015 No evidence of recurrence today  Recommend regular full body skin exams Recommend daily broad spectrum sunscreen SPF 30+ to sun-exposed areas, reapply every 2 hours as needed.  Call if any new or changing lesions are noted between office visits     Return in about 2 years (around 12/02/2025) for TBSE, hx of Dysplastic nevus .  IFay Kirks, CMA, am acting as scribe for Alm Rhyme, MD .   Documentation: I have reviewed the above documentation for accuracy and completeness, and I agree with the above.  Alm Rhyme, MD

## 2023-12-03 NOTE — Patient Instructions (Signed)

## 2023-12-24 DIAGNOSIS — Z170 Estrogen receptor positive status [ER+]: Principal | ICD-10-CM

## 2023-12-24 DIAGNOSIS — C50911 Malignant neoplasm of unspecified site of right female breast: Principal | ICD-10-CM

## 2023-12-24 MED ORDER — ANASTROZOLE 1 MG TABLET
ORAL_TABLET | Freq: Every day | ORAL | 3 refills | 0.00000 days
Start: 2023-12-24 — End: ?

## 2023-12-25 MED ORDER — ANASTROZOLE 1 MG TABLET
ORAL_TABLET | Freq: Every day | ORAL | 3 refills | 93.00000 days | Status: CP
Start: 2023-12-25 — End: ?

## 2024-03-22 ENCOUNTER — Inpatient Hospital Stay
Admit: 2024-03-22 | Discharge: 2024-03-23 | Payer: Medicare (Managed Care) | Attending: Adult Health | Primary: Adult Health
